# Patient Record
Sex: Female | Born: 1945 | Race: White | Hispanic: No | Smoking: Never smoker
Health system: Southern US, Community
[De-identification: ages and names within clinical notes are randomized; demographics above are authoritative.]

## PROBLEM LIST (undated history)

## (undated) DIAGNOSIS — R55 Syncope and collapse: Secondary | ICD-10-CM

## (undated) DIAGNOSIS — I639 Cerebral infarction, unspecified: Secondary | ICD-10-CM

## (undated) DIAGNOSIS — F32A Depression, unspecified: Secondary | ICD-10-CM

## (undated) DIAGNOSIS — Z79899 Other long term (current) drug therapy: Secondary | ICD-10-CM

## (undated) DIAGNOSIS — E119 Type 2 diabetes mellitus without complications: Secondary | ICD-10-CM

## (undated) DIAGNOSIS — F419 Anxiety disorder, unspecified: Secondary | ICD-10-CM

## (undated) DIAGNOSIS — F329 Major depressive disorder, single episode, unspecified: Secondary | ICD-10-CM

## (undated) DIAGNOSIS — IMO0001 Reserved for inherently not codable concepts without codable children: Secondary | ICD-10-CM

## (undated) DIAGNOSIS — K219 Gastro-esophageal reflux disease without esophagitis: Secondary | ICD-10-CM

## (undated) DIAGNOSIS — F0390 Unspecified dementia without behavioral disturbance: Secondary | ICD-10-CM

## (undated) DIAGNOSIS — Z950 Presence of cardiac pacemaker: Secondary | ICD-10-CM

## (undated) DIAGNOSIS — I10 Essential (primary) hypertension: Secondary | ICD-10-CM

## (undated) DIAGNOSIS — M797 Fibromyalgia: Secondary | ICD-10-CM

## (undated) DIAGNOSIS — M1990 Unspecified osteoarthritis, unspecified site: Secondary | ICD-10-CM

## (undated) DIAGNOSIS — I480 Paroxysmal atrial fibrillation: Secondary | ICD-10-CM

## (undated) DIAGNOSIS — I219 Acute myocardial infarction, unspecified: Secondary | ICD-10-CM

## (undated) DIAGNOSIS — I509 Heart failure, unspecified: Secondary | ICD-10-CM

## (undated) HISTORY — DX: Other long term (current) drug therapy: Z79.899

## (undated) HISTORY — PX: ABDOMINAL HYSTERECTOMY: SHX81

## (undated) HISTORY — DX: Essential (primary) hypertension: I10

## (undated) HISTORY — PX: INSERT / REPLACE / REMOVE PACEMAKER: SUR710

## (undated) HISTORY — DX: Syncope and collapse: R55

## (undated) HISTORY — DX: Paroxysmal atrial fibrillation: I48.0

## (undated) HISTORY — PX: APPENDECTOMY: SHX54

## (undated) HISTORY — PX: BACK SURGERY: SHX140

---

## 1997-11-26 ENCOUNTER — Ambulatory Visit (HOSPITAL_COMMUNITY): Admission: RE | Admit: 1997-11-26 | Discharge: 1997-11-26 | Payer: Self-pay

## 2005-08-25 ENCOUNTER — Ambulatory Visit: Payer: Self-pay | Admitting: Oncology

## 2006-02-21 ENCOUNTER — Ambulatory Visit: Payer: Self-pay | Admitting: Thoracic Surgery (Cardiothoracic Vascular Surgery)

## 2006-02-28 ENCOUNTER — Ambulatory Visit: Payer: Self-pay | Admitting: Thoracic Surgery (Cardiothoracic Vascular Surgery)

## 2006-04-04 ENCOUNTER — Ambulatory Visit: Payer: Self-pay | Admitting: Cardiothoracic Surgery

## 2007-05-30 ENCOUNTER — Encounter: Admission: RE | Admit: 2007-05-30 | Discharge: 2007-05-30 | Payer: Self-pay | Admitting: Gastroenterology

## 2008-02-07 ENCOUNTER — Observation Stay (HOSPITAL_COMMUNITY): Admission: EM | Admit: 2008-02-07 | Discharge: 2008-02-07 | Payer: Self-pay | Admitting: Emergency Medicine

## 2009-01-13 ENCOUNTER — Ambulatory Visit: Payer: Self-pay | Admitting: Cardiovascular Disease

## 2009-01-29 ENCOUNTER — Ambulatory Visit: Payer: Self-pay | Admitting: Cardiovascular Disease

## 2009-02-08 ENCOUNTER — Ambulatory Visit: Payer: Self-pay | Admitting: Internal Medicine

## 2009-02-18 ENCOUNTER — Ambulatory Visit: Payer: Self-pay | Admitting: Cardiovascular Disease

## 2009-03-04 ENCOUNTER — Encounter (INDEPENDENT_AMBULATORY_CARE_PROVIDER_SITE_OTHER): Payer: Self-pay | Admitting: *Deleted

## 2009-10-14 ENCOUNTER — Encounter (INDEPENDENT_AMBULATORY_CARE_PROVIDER_SITE_OTHER): Payer: Self-pay | Admitting: *Deleted

## 2010-01-30 IMAGING — CT CT ANGIO ABDOMEN
2 of 5 series · 16 of 46 positions shown, 18 images · IV contrast ([ID] OMNI 350)
Comparison: None

CTA ABDOMEN

CLINICAL DATA: Postprandial abdominal pain

CT ANGIOGRAPHY OF ABDOMEN AND PELVIS WITHOUT AND/OR WITH CONTRAST
TECHNIQUE: Multidetector CT imaging of the abdomen and pelvis was
performed before and during bolus injection of intravenous
contrast.  Multiplanar CT angiographic image reconstructions were
also generated to evaluate the vascular structures.
Contrast: 100 ml Omnipaque 350

[Series 5: recon 2: angio · axial · 0.74mm/px · z∈[-438,-4]mm · 13 of 286 slices shown, 15 images]
[im 13/286  soft-tissue]
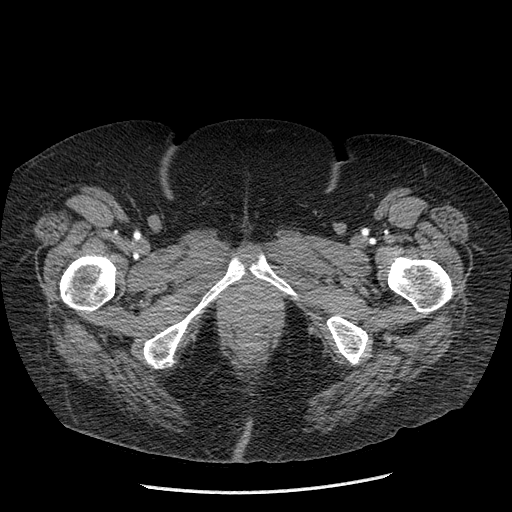
[im 13/286  bone]
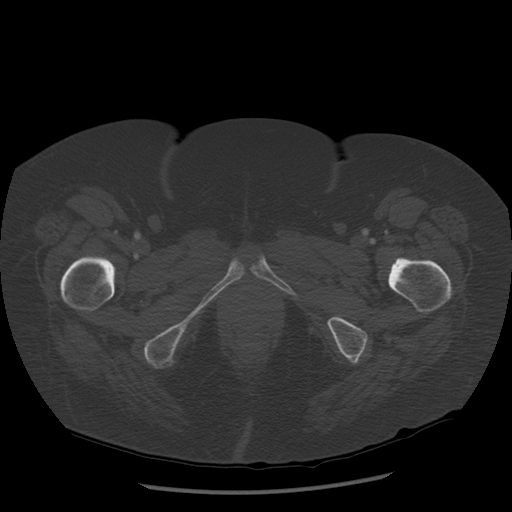
[im 38/286  soft-tissue]
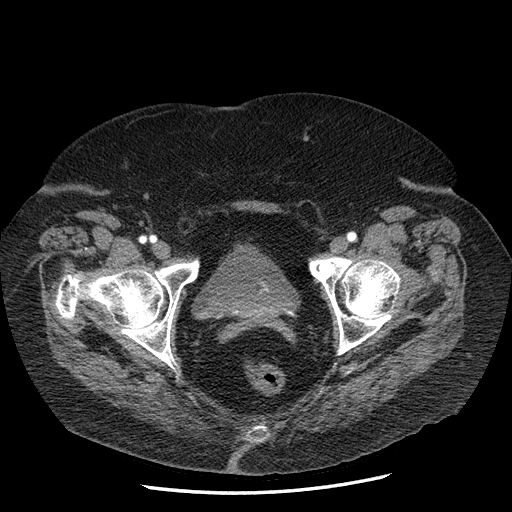
[im 62/286  soft-tissue]
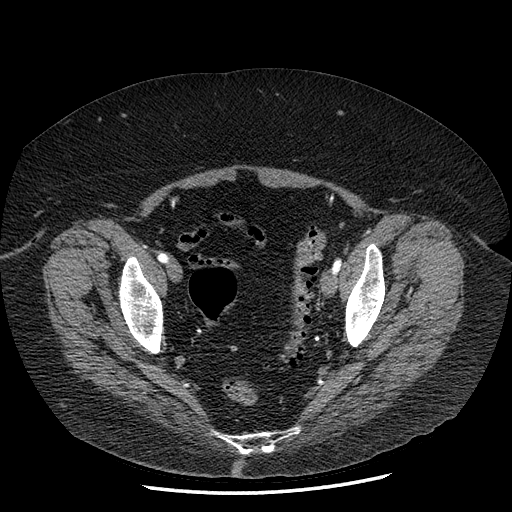
[im 75/286  soft-tissue]
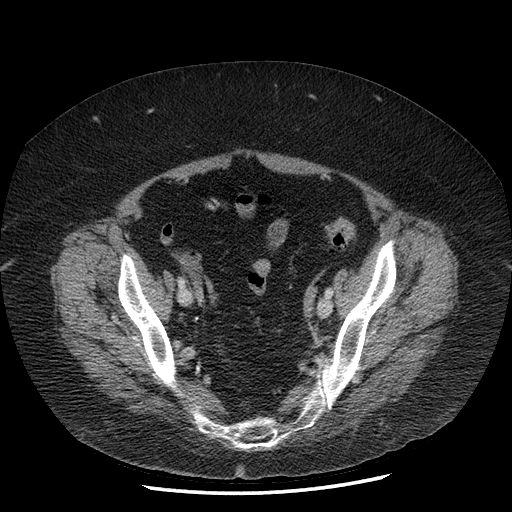
[im 100/286  soft-tissue]
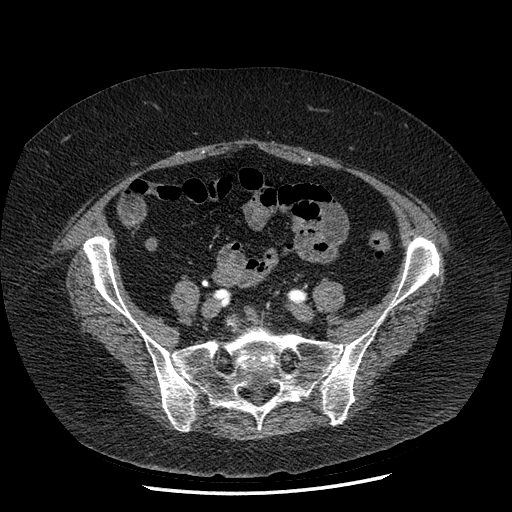
[im 124/286  soft-tissue]
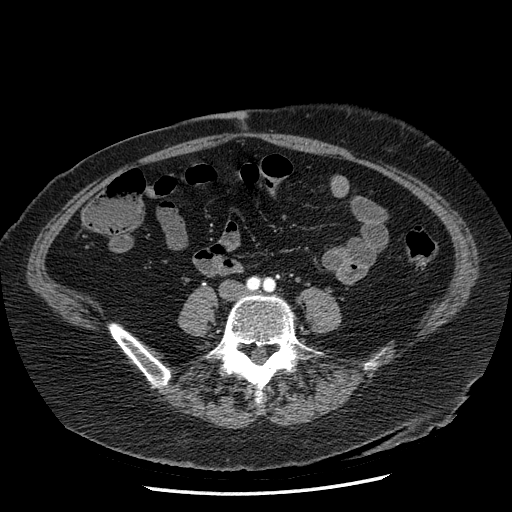
[im 149/286  soft-tissue]
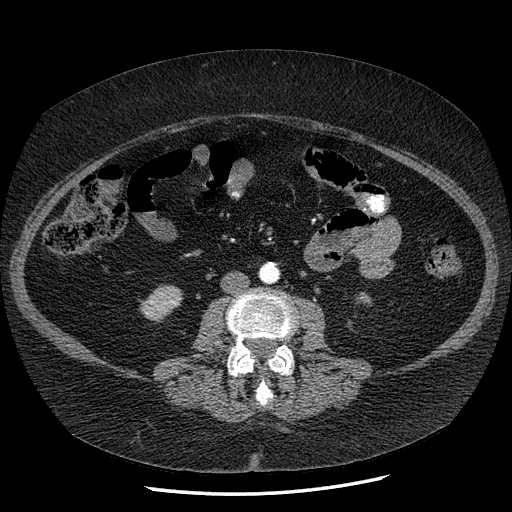
[im 162/286  soft-tissue]
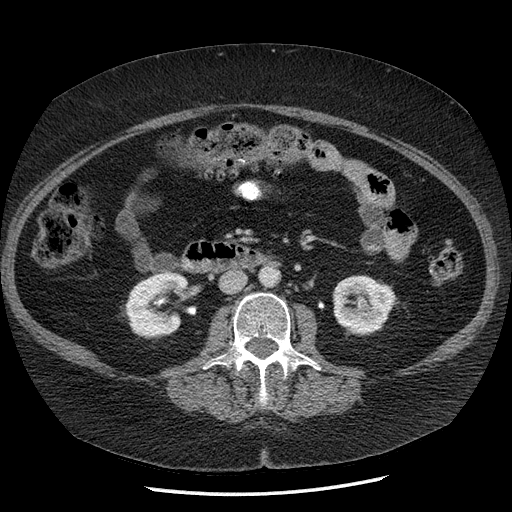
[im 186/286  soft-tissue]
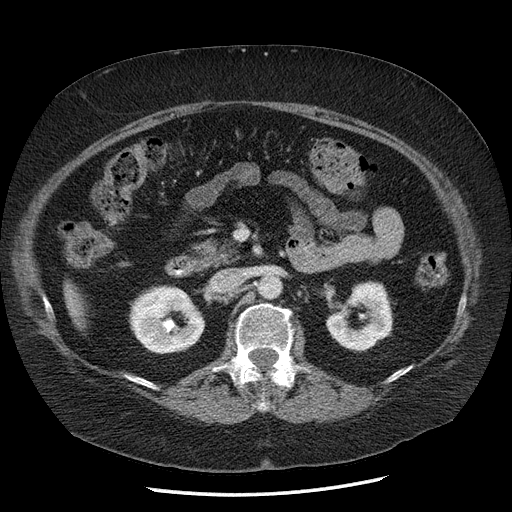
[im 186/286  bone]
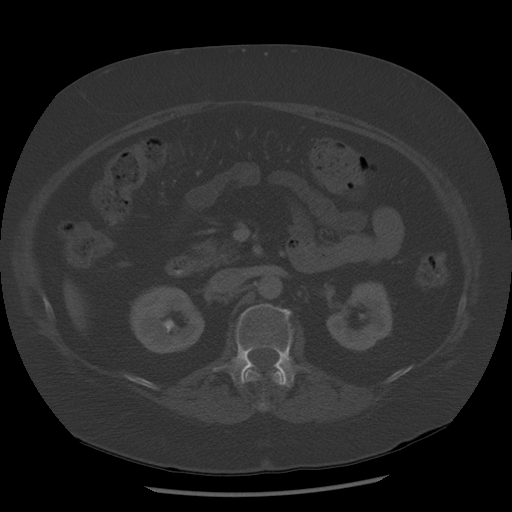
[im 211/286  soft-tissue]
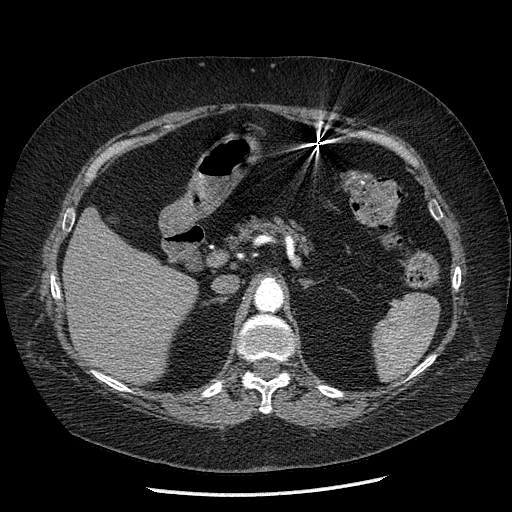
[im 224/286  soft-tissue]
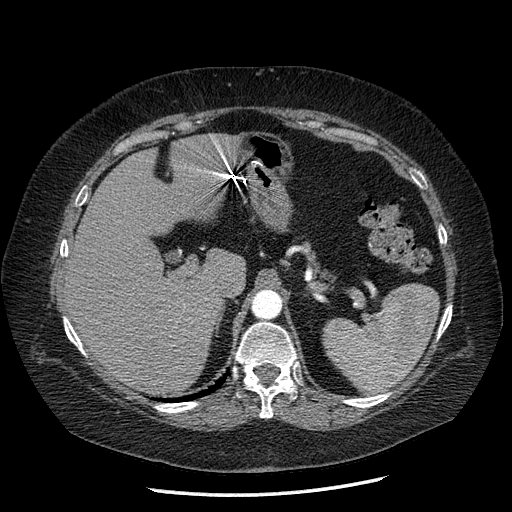
[im 248/286  soft-tissue]
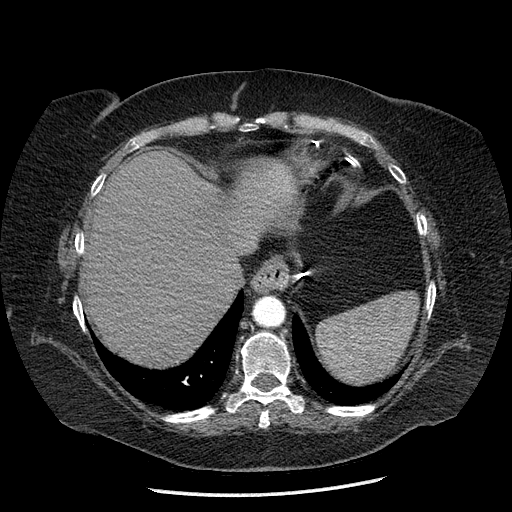
[im 273/286  soft-tissue]
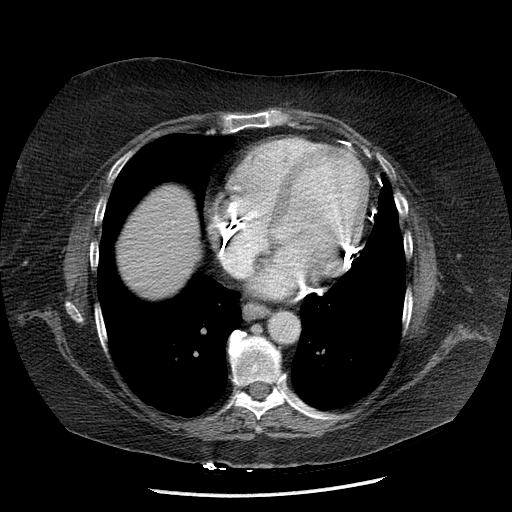

[Series 602: sagittal body · sagittal · 0.94mm/px · 3 of 153 slices shown]
[im 51/153  soft-tissue]
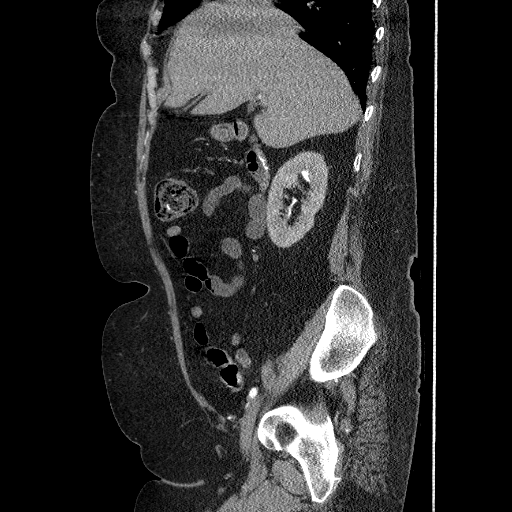
[im 68/153  soft-tissue]
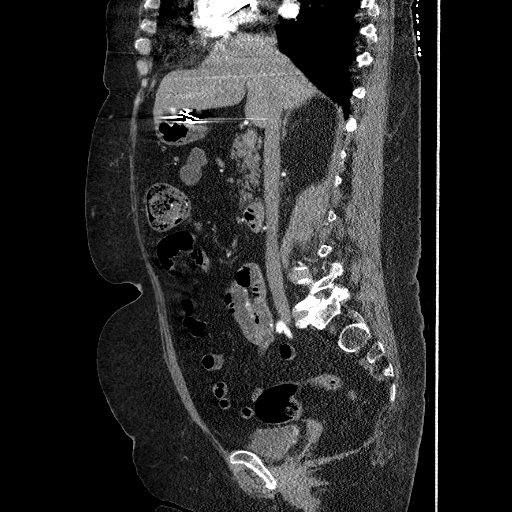
[im 85/153  soft-tissue]
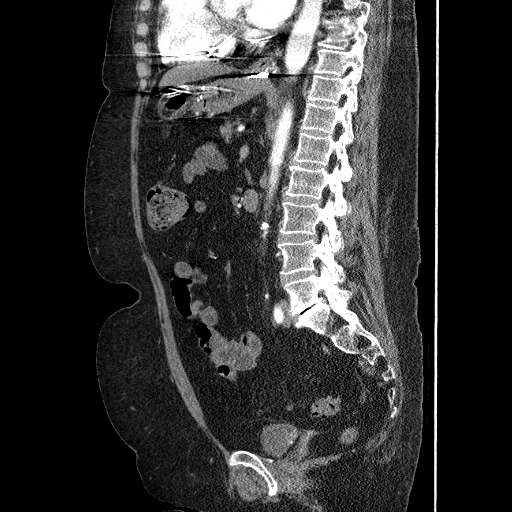

[16 of 46 positions shown; findings below may reference images not displayed]

FINDINGS: No evidence of aortic aneurysm.  Mild infrarenal
atherosclerotic disease.  Celiac artery, superior mesenteric
artery, and inferior mesenteric artery all widely patent.  There
are accessory inferior renal arteries bilaterally, arising just
above the IMA origin.  No significant stenosis in the main renal
arteries bilaterally.  The accessory renal arteries are very small
and difficult to evaluate.  There may be mild narrowing at the
origin of the right accessory renal artery.

Postoperative changes noted in the abdomen in the region of the
stomach.  Liver, spleen, pancreas, adrenals, kidneys unremarkable.
There is diverticular disease in the transverse and descending
colon.  No evidence of active diverticulitis.

No free fluid, free air, or adenopathy.  The patient is status post
cholecystectomy.

No acute bony abnormality.  There is mild cardiomegaly pacer wires
are noted.  Lung bases are clear.
IMPRESSION: No evidence of aortic aneurysm.  Mild atherosclerotic disease in
the infrarenal aorta.

No evidence of mesenteric artery stenosis.

Accessory renal arteries bilaterally, with possible mild narrowing
in the inferior right accessory renal artery.  Main renal arteries
are widely patent.

Left-sided colonic diverticulosis.

CTA PELVIS
FINDINGS: Extensive sigmoid diverticulosis.  No evidence of active
diverticulitis.  No free fluid, free air, or adenopathy.  No acute
bony abnormality.  Degenerative changes at the lumbosacral
junction.

Mild atherosclerotic disease noted in the external iliac arteries
and common femoral arteries bilaterally.  No significant stenosis.
Both internal iliac arteries are widely patent.
IMPRESSION: No evidence of significant arterial stenosis.  Mild atherosclerotic
disease.

Sigmoid diverticulosis.

## 2010-02-03 ENCOUNTER — Encounter (INDEPENDENT_AMBULATORY_CARE_PROVIDER_SITE_OTHER): Payer: Self-pay | Admitting: *Deleted

## 2010-02-10 NOTE — Procedures (Signed)
Summary: Cardiology Device Clinic   [facility] Specifications Following MD:  Sherryl Manges, MD     PPM Vendor:  [employer]     PPM Model Number:  [id number]     PPM Serial Number:  [id number] PPM DOI:  12/08/2003     PPM Implanting MD:  NOT IMPLANTED HERE  Lead 1    Location: RA     DOI: 01/15/2000     Model #: [id number]     Serial #: [id number]     Status: active Lead 2    Location: RV     DOI: 01/15/2000     Model #: [id number]     Serial #: [id number]     Status: active Lead 3    Location: LV     DOI: 02/28/2006     Model #: [id number]     Serial #: [id number]     Status: active  Magnet Response Rate:  BOL 85 ERI  65  Indications:  A-fib with AVnode ablation   PPM Follow Up Remote Check?  No Battery Voltage:  2.868 V     Battery Est. Longevity:  11 months     Pacer Dependent:  Yes       PPM Device Measurements Atrium  Impedance: 361 ohms,  Right Ventricle  Impedance: 322 ohms,  Left Ventricle  Impedance: 785 ohms,   Episodes MS Episodes:  2649     Percent Mode Switch:  5%     Coumadin:  Yes Ventricular High Rate:  0     Atrial Pacing:  75.2%     Ventricular Pacing:  100%  Parameters Mode:  DDD     Lower Rate Limit:  70     Upper Rate Limit:  120 Paced AV Delay:  220     Sensed AV Delay:  190 Tech Comments:  Reprogrammed to DDD.   Altha Harm, LPN  February 08, 2009 4:13 PM

## 2010-02-10 NOTE — Letter (Signed)
Summary: Device-Delinquent Check  [facility], Main Office  [address]   [place], [place] [address]   Phone: [phone]  Fax: [phone]     February 03, 2010 MRN: [id number]   ADDISSON FRATE [address] [place], [place]  [address]   Dear Ms. Cressy,  According to our records, you have not had your implanted device checked in the recommended period of time.  We are unable to determine appropriate device function without checking your device on a regular basis.  Please call our office to schedule an appointment, with Dr Graciela Husbands,  as soon as possible.  If you are having your device checked by another physician, please call us so that we may update our records.  Thank you,  Letta Moynahan, EMT  February 03, 2010 12:45 PM  [facility] Device [facility] certified

## 2010-02-10 NOTE — Letter (Signed)
Summary: Appointment - Missed  [facility], Main Office  [address]   [place], [place] [address]   Phone: [phone]  Fax: [phone]     March 04, 2009 MRN: [id number]   Kellie Acosta [address] [place], [place]  [address]   Dear [name],  Our records indicate you missed your appointment on 02/24/09 with [facility] for wound check. It is very important that we reach you to reschedule this appointment. We look forward to participating in your health care needs. Please contact us at the number listed above at your earliest convenience to reschedule this appointment.     Sincerely,   Ruel Favors Scheduling Team

## 2010-02-10 NOTE — Letter (Signed)
Summary: Device-Delinquent Check  [facility], Main Office  [address]   [place], [place] [address]   Phone: [phone]  Fax: [phone]     October 14, 2009 MRN: [id number]   Kellie Acosta [address] [place], [place]  [address]   Dear [name],  According to our records, you have not had your implanted device checked in the recommended period of time.  We are unable to determine appropriate device function without checking your device on a regular basis.  Please call our office to schedule an appointment, with Dr Graciela Husbands,  as soon as possible.  If you are having your device checked by another physician, please call us so that we may update our records.  Thank you,  Letta Moynahan, EMT  October 14, 2009 11:40 AM  [facility]

## 2010-02-18 ENCOUNTER — Telehealth (INDEPENDENT_AMBULATORY_CARE_PROVIDER_SITE_OTHER): Payer: Self-pay | Admitting: *Deleted

## 2010-02-20 ENCOUNTER — Encounter: Payer: Self-pay | Admitting: Internal Medicine

## 2010-02-24 NOTE — Progress Notes (Signed)
Summary: pt no longer live in the area  Phone Note Call from Patient   Reason for Call: Talk to Nurse, Talk to Doctor Summary of Call: pt has moved and dose not live in area anymore and if she moves back she will call us Initial call taken by: Omer Jack,  February 18, 2010 2:09 PM  Follow-up for Phone Call        noted. Follow-up by: Altha Harm, LPN,  February 18, 2010 4:58 PM

## 2010-03-08 NOTE — Cardiovascular Report (Signed)
Summary: Certified Letter Signed - Other (Pt moved, marked inactive)  Certified Letter Signed - Other (Pt moved, marked inactive)   Imported By: Debby Freiberg 03/02/2010 13:21:30  _____________________________________________________________________  External Attachment:    Type:   Image     Comment:   External Document

## 2010-04-25 LAB — GLUCOSE, CAPILLARY

## 2010-04-25 LAB — COMPREHENSIVE METABOLIC PANEL
ALT: 13 U/L (ref 0–35)
Alkaline Phosphatase: 100 U/L (ref 39–117)
CO2: 25 mEq/L (ref 19–32)
Glucose, Bld: 129 mg/dL — ABNORMAL HIGH (ref 70–99)
Potassium: 3.9 mEq/L (ref 3.5–5.1)
Sodium: 137 mEq/L (ref 135–145)
Total Protein: 6.1 g/dL (ref 6.0–8.3)

## 2010-04-25 LAB — CBC
Hemoglobin: 11.3 g/dL — ABNORMAL LOW (ref 12.0–15.0)
RBC: 3.93 MIL/uL (ref 3.87–5.11)
RDW: 15.9 % — ABNORMAL HIGH (ref 11.5–15.5)

## 2010-04-25 LAB — DIFFERENTIAL
Basophils Relative: 0 % (ref 0–1)
Eosinophils Absolute: 0.1 10*3/uL (ref 0.0–0.7)
Eosinophils Relative: 1 % (ref 0–5)
Monocytes Relative: 9 % (ref 3–12)
Neutrophils Relative %: 70 % (ref 43–77)

## 2010-04-25 LAB — URINALYSIS, ROUTINE W REFLEX MICROSCOPIC
Nitrite: NEGATIVE
Specific Gravity, Urine: 1.03 (ref 1.005–1.030)
pH: 5.5 (ref 5.0–8.0)

## 2010-04-25 LAB — AMMONIA: Ammonia: 20 umol/L (ref 11–35)

## 2010-05-24 NOTE — Letter (Signed)
February 08, 2009    Brayton El, MD  [phone] [address]  [place], [place]  [address]   RE:  Acosta, Kellie  MRN:  [id number]  /  DOB:  Jun 14, 1945   Dear Kellie Acosta:   It was a pleasure seeing Kellie Acosta at your request to establish  develop followup for a previously implanted CRT-P as well as evaluation  of syncope.   Kellie Acosta is a 65 year old woman with a history of a pacemaker  implanted in 2002 following an A-V junction ablation which apparently  was complicated by a pacemaker-induced cardiomyopathy.  In 2005, she  underwent upgrade to a CRT device which was complicated by diaphragmatic  pacing and resulted in the placement of an epicardial lead in 2008.  Her  most recently assessed ejection fraction is 55%.  We do not have  information as to what it was at its nadir.   She has a history of atrial fibrillation as noted; this has been  paroxysmal.  She was treated with Coumadin for some time.  However,  because of GI bleeding, this was discontinued.   Her functional capacity is quite limited.  She describes shortness of  breath at about 100 yards.  She also underwent Myoview scanning because  of this, and that was negative.  Her shortness of breath which has been  progressive has been unassociated with chest discomfort.   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  She has a history of recurrent syncope over the  last year.  Almost all of these episodes occur within a minute of going  from sitting to standing.  It is frequently without warning.  She has  shower intolerance as well.   There was an episode for which we have only incomplete records when she  became nauseated, lightheaded and lost consciousness.  She said she was  out for 11 hours, and she said that Dr. Weldon Inches in the [facility] Room said he was surprised to see her which she inferred was  that he thought that she was going to die.  Again,  those complete  records are not available, although the brief record that we have  describes dehydration as the primary discharge diagnosis.   PAST MEDICAL HISTORY:  In addition to the above is notable for the  following:  1. GE reflux disease with esophageal stricture.  2. Chronic pain requiring a fentanyl patch.  3. Adrenal adenoma.   REVIEW OF SYSTEMS:  In addition to the above is notable for allergies  and asthma, constipation and fatigue, kidney diseases, anxiety and  depression, gout, arthritis, and urinary problems.   PAST SURGICAL HISTORY:  Noted as negative.   FAMILY HISTORY:  Noncontributory.   MEDICATIONS:  1. Lexapro 10.  2. Lidoderm.  3. Gabapentin 600 b.i.d.  4. Crestor 20.  5. Lyrica 300 t.i.d.  6. Trimethoprim.  7. Diovan 320.  8. Potassium.  9. Nabumetone 780 b.i.d.  10.Spironolactone 25.  11.Alprazolam 1 b.i.d.  12.Requip 3 at bedtime.  13.Dicyclomine.  14.Ambien 10 at bedtime.  15.Meloxicam 15.  16.Amlodipine 10.  17.Furosemide 80.  18.Metoprolol tartrate 100 b.i.d.  19.Detrol.  20.Aricept 5.  21.__________ 75 b.i.d.  22.Loratadine.  23.Cipro.  24.Omeprazole.  25.Indomethacin.   She has no known drug allergies.   I should note that she has significant edema.   PHYSICAL EXAMINATION:  GENERAL:  She is an older Caucasian female  expressing satisfaction at  the care in our office.  VITAL SIGNS:  Blood pressure is 107/74, pulse 93, weight 214.6 pounds  which is up a few pounds over the last couple of months.  HEENT:  Normal apart from poor dentition.  NECK:  Neck veins were flat.  Carotids were brisk and full bilaterally,  no bruits.  BACK:  Without kyphosis or scoliosis.  LUNGS:  Clear.  CARDIAC:  Heart sounds were regular without murmurs or gallops.  ABDOMEN:  Soft with active bowel sounds without midline pulsation or  hepatomegaly.  Femoral pulses were 2+.  EXTREMITIES:  Distal pulses were intact.  There is no clubbing, cyanosis  or  edema.  NEUROLOGICAL:  Grossly normal.   Orthostatic vital signs demonstrated fall in blood pressure from 135 to  113 with standing.  That persisted at 115 over 5 minutes.   We interrogated her device and noted that her heart rate excursion  seemed to be a little bit right shifted in that only 16% of her beats  were in the lower .  I turned off her rate response to see how this  would help, and we walked her around the office; there seemed to be some  mild improvement in her exercise tolerance.   IMPRESSION:  1. History of atrial fibrillation, paroxysmal.  2. Status post atrioventricular junction for the above.  3. Status post pacer for the above with subsequent development of      pacemaker-induced cardiomyopathy with upgrade to a biventricular      device.  This was complicated by diaphragmatic stimulation, and the      patient has an epicardial LV lead.  4. History of hypertension.  5. 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  6. Polypharmacy.   So, Mrs. Capek pacemaker was reprogrammed to inactivate rate  response.  Hopefully, this will translate into improved exercise  performance.  I think her syncope is orthostatic in  nature, and I have  given her a variety of multiple contraction exercises prior to standing  and immediately upon standing to try to abrogate some of her symptoms.  She also is on a lot of medication, and I wonder about interactions  between these medications and the potential for them to aggravate  orthostatic lightheadedness.  She is also on a lot of psychotropic  medications, and these too may be narrowed down if possible.   Her pacemaker battery has an estimated 11 months.  We will plan to see  her again in 6 months' time and will continue to transtelephonic  monitoring in the interim.   Thank you for the consultation.    Sincerely,      Duke Salvia, MD, [facility]  Electronically Signed    SCK/MedQ  DD: 02/08/2009  DT:  02/08/2009  Job #: [id number]
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
## 2010-05-24 NOTE — Assessment & Plan Note (Signed)
[facility]                        [place] [facility] OFFICE NOTE   Kellie Acosta, Kellie Acosta                         MRN:          [id number]  DATE:01/29/2009                            DOB:          28-Jun-1945    PROBLEM LIST:  1. Atrial fibrillation status post atrioventricular node ablation in      2002 and placement of a permanent pacemaker which was upgraded to a      biventricular pacemaker in 2005 secondary to a single lead      pacemaker-induced cardiomyopathy.  At that time, she had      diaphragmatic pacing which resulted in an epicardial pacemaker lead      placement in 2008.  She was on Coumadin up until this time, but was      taken off eventually for gastric ulcers and gastrointestinal      bleeding.  2. Malignant hypertension.  3. Hyperlipidemia.  4. History of syncope.  5. Esophageal stricture.  6. Adrenal adenoma.  7. Gastroesophageal reflux disease.  8. Chronic pain requiring a fentanyl patch.   INTERVAL HISTORY:  The patient states since her last visit, she  continues to have some intermittent dizziness and chest discomfort.  She  states the chest discomfort is usually with exertion and has been  present for at least 6 months.  She also tells [place] that she has had 5  normal left heart catheterizations in the past.  Since her last visit,  she had a nuclear stress test which show a fixed defect involving the  anterior wall likely related to breast attenuation, but a normal  ejection fraction and no evidence of inducible ischemia.  Since last  visit, she has had intermittent dizzy episodes, but denies any further  episodes of syncope.  She has been compliant with her medications, but  asks that her cardiovascular medications be reduced in number.   REVIEW OF SYSTEMS:  Positive for continued episodes of diaphoresis that  occur at night and during the day.  They are not necessarily related to  exertion.   PHYSICAL EXAMINATION:  VITAL  SIGNS:  Blood pressure is 137/87, pulse is  85, sating 96% on room air, and that she weighs 213 pounds.  GENERAL:  No acute distress.  HEENT:  Normocephalic, atraumatic.  HEART:  Regular rate and rhythm.  LUNGS:  Clear bilaterally.  ABDOMEN:  Soft, nontender.  EXTREMITIES:  Trace edema.   LABORATORY FINDINGS:  Review of the stress test as above in interval  history.  Review of the patient's labs, TSH was 0.991, aldosterone 11.8,  renin 2.9, white count 7, hemoglobin 14, hematocrit 42, platelet count  319.  Total cholesterol 340, LDL 241, triglycerides 199.  INR 1.0.  Sodium 136, potassium 4.1, chloride 104, CO2 24, BUN 15, creatinine  0.85, glucose 136.  LFTs within normal limits.   ASSESSMENT AND PLAN:  1. Atrial fibrillation.  The patient is status post atrioventricular      node ablation.  She is not on Coumadin secondary to history of      gastrointestinal bleeding.  She is on metoprolol  tartrate 100 mg      twice a day.  Today, we will stop her digoxin as she does not have      left ventricular dysfunction and it is likely not of any benefit      for rate control.  2. Chest discomfort.  She has had a negative stress test and multiple      heart catheterizations that had been negative in the past.  I do      not think that this chest discomfort is ischemia in origin.  She      does have a history of esophageal strictures which may be      contributing.  She plans on seeing a gastroenterologist in the near      future for a followup colonoscopy as her last colonoscopy showed      multiple polyps.  At that time, EGD can also be considered.  3. Hyperlipidemia.  The patient's LDL is far from goal.  She states      compliance with Crestor 10 mg daily, which we will increase this to      20 mg daily.  4. Diaphoresis.  I do not think this is related to coronary artery      disease.  We will recheck a TSH level with a free T4 and total T3.      We also recommended she see a  gastroenterologist as she does have a      history of a premalignant polyps on colonoscopy.  Other labs are      within normal limits and she is up-to-date with her mammograms.  5. Hypertension.  Blood pressure is controlled today.  She should      continue on her current medical regimen for now.   The patient is scheduled to see Dr. Graciela Husbands at the end of this month for  pacemaker interrogation and to identify any rhythm abnormalities that  may be contributing to her dizziness and history of syncope.  I will see  the patient back in 3 months' time.     Brayton El, MD  Electronically Signed    SGA/MedQ  DD: 01/29/2009  DT: 01/30/2009  Job #: [id number]

## 2010-05-27 NOTE — Assessment & Plan Note (Signed)
[facility]                        [facility] OFFICE NOTE   Kellie Acosta, Kellie Acosta                         MRN:          [id number]  DATE:01/14/2009                            DOB:          Feb 03, 1945    CHIEF COMPLAINT:  Palpitations and hypertension.   HISTORY OF PRESENT ILLNESS:  Kellie Acosta is a 65 year old white female  past medical history significant for atrial fibrillation, hypertension,  hyperlipidemia, gastric ulcers who is presenting to establish  cardiovascular care.  In 2002, the patient had an AV node ablation for  atrial fibrillation and placement of a permanent pacemaker.  In 2005, it  was upgraded to BiV pacemaker due to cardiomyopathy from the single-lead  pacemaker.  She had improvement in her symptoms but has subsequently had  diaphragmatic pacing which resulted in epicardial pacemaker lead  placement in 2008.  She had been on Coumadin up to this point and was  taken off Coumadin about a year ago, most likely for issues with gastric  ulcers and bleeding.  Her most recent ejection fraction was from an echo  dated November 23, 2008 that showed normal left ventricular systolic  function and no significant regurgitation.  The patient states that for  the past year, she has had recurrent dizzy episodes.  Within the past  month she had two such episodes.  She states that the episodes usually  start with her feeling palpitations in her chest followed by nausea.  These resolve within 5 minutes.  However, if she stands up during these  episodes, she passes out.  One of these episodes occurred within the  past week.  However, she did stand up and the palpitations went away.  She did have one syncopal episode after standing with the palpitations  several weeks ago.  The patient states she almost always feels some  degree of palpitations.  However, they are not frequently severe.  In  addition to the palpitations, the patient also describes  of intermittent  chest discomfort that she really is unable to explain in any amount of  detail.  She states that she has struggled with uncontrolled  hypertension for many years and has a strong desire to decrease the  amount of medications she is taking if at all possible.   PAST MEDICAL HISTORY:  As above in HPI.  The patient also has a history  of esophageal stricture, adrenal adenoma, GERD.   SOCIAL HISTORY:  No tobacco.  No significant amounts of alcohol.   Family history is positive for coronary disease.  However, it is unclear  whether it is premature coronary disease or not.   ALLERGIES:  Penicillin.   MEDICATIONS:  Following medication list needs to be confirmed with the  actual pill bottles that she is taking however, the patient brings in a  list today that has carvedilol 12.5 mg b.i.d., amlodipine 10 mg daily,  metoprolol tartrate 100 mg b.i.d., isosorbide 60 mg daily, digoxin 0.125  mg daily, Catapres 0.1 mg b.i.d., Aldactone 25 mg daily, Diovan 320 mg  daily, Crestor 20 mg daily, Protonix 40 mg daily, dicyclomine, Requip,  Ambien, Lexapro, Lidoderm patch, gabapentin, Flexeril, Lyrica,  trimethoprim, potassium, nabumetone, alprazolam, meloxicam, Vesicare,  Lasix 80 mg b.i.d., Percocet p.r.n., Spiriva p.r.n., Nasonex p.r.n.   REVIEW OF SYSTEMS:  The patient endorses fatigue, some symptoms of  depression that are oftentimes related to her son who had a tragic  sledding accident several years ago.  She complains of some occasional  abdominal discomfort, constipation, arthritis, gout, anxiety, breathing  difficulties secondary to asthma and allergies.  Other systems as in  HPI, otherwise negative.   PHYSICAL EXAM:  VITAL SIGNS:  Blood pressure 161/88, pulse 74, satting  95% on room air.  She weighs 211 pounds.  GENERAL:  She is in no acute distress.  HEENT:  Normocephalic, atraumatic.  NECK:  Supple.  There was no JVD.  There are no carotid bruits.  HEART:  Regular  rate and rhythm without murmur, rub, or gallop.  Heart  sounds were distant.  LUNGS:  Clear bilaterally.  ABDOMEN:  Soft and nondistended.  EXTREMITIES:  Trace bilateral lower extremity edema.  PSYCHIATRIC:  The patient is appropriate, does appear somewhat anxious.  MUSCULOSKELETAL:  The patient has 5/5 bilateral upper and lower  extremity strength.   EKG from today independently interpreted by myself demonstrates AV  sequential pacing at a rate of 72 beats per minute on review of the  patient's medical record as above in HPI.   Review of the patient's labs dated October 2010, sodium 137, potassium  4.1, chloride 107, CO2 21, BUN 13, creatinine 0.9, glucose 122, BNP was  83, TSH is 1.07.   ASSESSMENT:  A 65 year old white female with multiple medical problems  including paroxysmal atrial fibrillation, status post AV nodal ablation  and BiV pacemaker and current BiV pacemaker, gastric ulcer disease  preventing anticoagulation with Coumadin and malignant hypertension who  is presenting with ill-defined chest discomfort, syncope and  palpitations.  The patient has not had an ischemia evaluation in a  number of years and the chest discomfort she is experiencing could  certainly be secondary to obstructive pulmonary disease.  The  palpitations she is experiencing could be secondary to atrial  fibrillation.  However, a recent thorough interrogation by the pacemaker  rep indicates that she has had numerous atrial tachycardic episodes that  appear to be secondary to far field sensing.  The syncopal events she is  having may be secondary to neurocardiogenic syncope.  The patient also  has very malignant hypertension.  She is on numerous blood pressure  medications, possibly including two different beta-blockers, yet still  has uncontrolled hypertension.  It is unclear whether this history of  adrenal adenoma may be involved.   PLAN:  We asked the patient that she return in the next day or  two with  the actual pill bottles that she is taking so she can confirm the  antihypertensives that she is on.  We will order a lexiscan Cardiolite  in order to reevaluate the left ventricular function, although was  normal several months ago and to rule out inducible ischemia.  We will  order a postop labs including a CMP, CBC, TSH, renin and aldosterone  level.  She is to see Dr. Berton Mount in [facility] on the 30th of this  month in order to have her pacemaker more thoroughly evaluated to rule  out any  possible arrhythmia as a cause for her syncope.  It should be noted that  the tragic accident that  the patient's son had several years ago  could certainly be contributing  to the patient's perception of her symptoms.     Brayton El, MD  Electronically Signed    SGA/MedQ  DD: 01/14/2009  DT: 01/15/2009  Job #: [id number]

## 2010-08-08 ENCOUNTER — Encounter: Payer: Self-pay | Admitting: Cardiovascular Disease

## 2010-08-31 ENCOUNTER — Encounter: Payer: Self-pay | Admitting: Cardiovascular Disease

## 2010-09-16 ENCOUNTER — Encounter: Payer: Self-pay | Admitting: Cardiovascular Disease

## 2014-12-15 ENCOUNTER — Encounter (HOSPITAL_COMMUNITY): Payer: Self-pay | Admitting: *Deleted

## 2014-12-15 ENCOUNTER — Inpatient Hospital Stay (HOSPITAL_COMMUNITY): Payer: Medicare Other

## 2014-12-15 ENCOUNTER — Inpatient Hospital Stay (HOSPITAL_COMMUNITY)
Admission: AD | Admit: 2014-12-15 | Discharge: 2014-12-24 | DRG: 539 | Disposition: A | Discharge status: Skilled Nursing Facility | Payer: Medicare Other | Source: Other Acute Inpatient Hospital | Attending: Internal Medicine | Admitting: Internal Medicine

## 2014-12-15 DIAGNOSIS — Z515 Encounter for palliative care: Secondary | ICD-10-CM | POA: Insufficient documentation

## 2014-12-15 DIAGNOSIS — E1169 Type 2 diabetes mellitus with other specified complication: Secondary | ICD-10-CM | POA: Diagnosis not present

## 2014-12-15 DIAGNOSIS — M4640 Discitis, unspecified, site unspecified: Secondary | ICD-10-CM | POA: Diagnosis present

## 2014-12-15 DIAGNOSIS — L89620 Pressure ulcer of left heel, unstageable: Secondary | ICD-10-CM | POA: Diagnosis not present

## 2014-12-15 DIAGNOSIS — Z79899 Other long term (current) drug therapy: Secondary | ICD-10-CM | POA: Diagnosis not present

## 2014-12-15 DIAGNOSIS — Z90710 Acquired absence of both cervix and uterus: Secondary | ICD-10-CM | POA: Diagnosis not present

## 2014-12-15 DIAGNOSIS — I252 Old myocardial infarction: Secondary | ICD-10-CM

## 2014-12-15 DIAGNOSIS — K219 Gastro-esophageal reflux disease without esophagitis: Secondary | ICD-10-CM | POA: Diagnosis present

## 2014-12-15 DIAGNOSIS — I2510 Atherosclerotic heart disease of native coronary artery without angina pectoris: Secondary | ICD-10-CM | POA: Diagnosis present

## 2014-12-15 DIAGNOSIS — M797 Fibromyalgia: Secondary | ICD-10-CM | POA: Diagnosis present

## 2014-12-15 DIAGNOSIS — J449 Chronic obstructive pulmonary disease, unspecified: Secondary | ICD-10-CM | POA: Diagnosis present

## 2014-12-15 DIAGNOSIS — Z9981 Dependence on supplemental oxygen: Secondary | ICD-10-CM

## 2014-12-15 DIAGNOSIS — M8610 Other acute osteomyelitis, unspecified site: Secondary | ICD-10-CM | POA: Diagnosis not present

## 2014-12-15 DIAGNOSIS — M4626 Osteomyelitis of vertebra, lumbar region: Principal | ICD-10-CM | POA: Diagnosis present

## 2014-12-15 DIAGNOSIS — D649 Anemia, unspecified: Secondary | ICD-10-CM | POA: Diagnosis present

## 2014-12-15 DIAGNOSIS — I10 Essential (primary) hypertension: Secondary | ICD-10-CM | POA: Diagnosis present

## 2014-12-15 DIAGNOSIS — N179 Acute kidney failure, unspecified: Secondary | ICD-10-CM | POA: Diagnosis present

## 2014-12-15 DIAGNOSIS — G8929 Other chronic pain: Secondary | ICD-10-CM | POA: Diagnosis not present

## 2014-12-15 DIAGNOSIS — I959 Hypotension, unspecified: Secondary | ICD-10-CM | POA: Diagnosis not present

## 2014-12-15 DIAGNOSIS — M545 Low back pain, unspecified: Secondary | ICD-10-CM | POA: Insufficient documentation

## 2014-12-15 DIAGNOSIS — G9340 Encephalopathy, unspecified: Secondary | ICD-10-CM | POA: Diagnosis present

## 2014-12-15 DIAGNOSIS — Z950 Presence of cardiac pacemaker: Secondary | ICD-10-CM | POA: Diagnosis present

## 2014-12-15 DIAGNOSIS — I509 Heart failure, unspecified: Secondary | ICD-10-CM | POA: Diagnosis not present

## 2014-12-15 DIAGNOSIS — F411 Generalized anxiety disorder: Secondary | ICD-10-CM | POA: Diagnosis present

## 2014-12-15 DIAGNOSIS — L8990 Pressure ulcer of unspecified site, unspecified stage: Secondary | ICD-10-CM | POA: Insufficient documentation

## 2014-12-15 DIAGNOSIS — E870 Hyperosmolality and hypernatremia: Secondary | ICD-10-CM | POA: Diagnosis present

## 2014-12-15 DIAGNOSIS — R627 Adult failure to thrive: Secondary | ICD-10-CM | POA: Diagnosis not present

## 2014-12-15 DIAGNOSIS — Z885 Allergy status to narcotic agent status: Secondary | ICD-10-CM | POA: Diagnosis not present

## 2014-12-15 DIAGNOSIS — Z8673 Personal history of transient ischemic attack (TIA), and cerebral infarction without residual deficits: Secondary | ICD-10-CM | POA: Diagnosis not present

## 2014-12-15 DIAGNOSIS — Z66 Do not resuscitate: Secondary | ICD-10-CM | POA: Diagnosis not present

## 2014-12-15 DIAGNOSIS — R0602 Shortness of breath: Secondary | ICD-10-CM

## 2014-12-15 DIAGNOSIS — M1990 Unspecified osteoarthritis, unspecified site: Secondary | ICD-10-CM | POA: Diagnosis present

## 2014-12-15 DIAGNOSIS — M4646 Discitis, unspecified, lumbar region: Secondary | ICD-10-CM | POA: Diagnosis present

## 2014-12-15 DIAGNOSIS — E118 Type 2 diabetes mellitus with unspecified complications: Secondary | ICD-10-CM

## 2014-12-15 DIAGNOSIS — E876 Hypokalemia: Secondary | ICD-10-CM | POA: Diagnosis not present

## 2014-12-15 DIAGNOSIS — E119 Type 2 diabetes mellitus without complications: Secondary | ICD-10-CM

## 2014-12-15 DIAGNOSIS — E785 Hyperlipidemia, unspecified: Secondary | ICD-10-CM | POA: Diagnosis not present

## 2014-12-15 DIAGNOSIS — I480 Paroxysmal atrial fibrillation: Secondary | ICD-10-CM | POA: Diagnosis not present

## 2014-12-15 DIAGNOSIS — R410 Disorientation, unspecified: Secondary | ICD-10-CM

## 2014-12-15 DIAGNOSIS — F19931 Other psychoactive substance use, unspecified with withdrawal delirium: Secondary | ICD-10-CM | POA: Diagnosis present

## 2014-12-15 DIAGNOSIS — M869 Osteomyelitis, unspecified: Secondary | ICD-10-CM

## 2014-12-15 DIAGNOSIS — J9601 Acute respiratory failure with hypoxia: Secondary | ICD-10-CM | POA: Diagnosis present

## 2014-12-15 DIAGNOSIS — Z880 Allergy status to penicillin: Secondary | ICD-10-CM | POA: Diagnosis not present

## 2014-12-15 HISTORY — DX: Unspecified osteoarthritis, unspecified site: M19.90

## 2014-12-15 HISTORY — DX: Fibromyalgia: M79.7

## 2014-12-15 HISTORY — DX: Reserved for inherently not codable concepts without codable children: IMO0001

## 2014-12-15 HISTORY — DX: Anxiety disorder, unspecified: F41.9

## 2014-12-15 HISTORY — DX: Gastro-esophageal reflux disease without esophagitis: K21.9

## 2014-12-15 HISTORY — DX: Unspecified dementia, unspecified severity, without behavioral disturbance, psychotic disturbance, mood disturbance, and anxiety: F03.90

## 2014-12-15 HISTORY — DX: Presence of cardiac pacemaker: Z95.0

## 2014-12-15 HISTORY — DX: Depression, unspecified: F32.A

## 2014-12-15 HISTORY — DX: Major depressive disorder, single episode, unspecified: F32.9

## 2014-12-15 HISTORY — DX: Type 2 diabetes mellitus without complications: E11.9

## 2014-12-15 HISTORY — DX: Heart failure, unspecified: I50.9

## 2014-12-15 HISTORY — DX: Cerebral infarction, unspecified: I63.9

## 2014-12-15 HISTORY — DX: Acute myocardial infarction, unspecified: I21.9

## 2014-12-15 LAB — CBC WITH DIFFERENTIAL/PLATELET
BASOS ABS: 0 10*3/uL (ref 0.0–0.1)
BASOS PCT: 0 %
EOS PCT: 5 %
Eosinophils Absolute: 0.7 10*3/uL (ref 0.0–0.7)
HEMATOCRIT: 35.5 % — AB (ref 36.0–46.0)
Hemoglobin: 10.5 g/dL — ABNORMAL LOW (ref 12.0–15.0)
Lymphocytes Relative: 12 %
Lymphs Abs: 1.6 10*3/uL (ref 0.7–4.0)
MCH: 26 pg (ref 26.0–34.0)
MCHC: 29.6 g/dL — ABNORMAL LOW (ref 30.0–36.0)
MCV: 87.9 fL (ref 78.0–100.0)
MONO ABS: 0.6 10*3/uL (ref 0.1–1.0)
Monocytes Relative: 4 %
NEUTROS ABS: 10.6 10*3/uL — AB (ref 1.7–7.7)
Neutrophils Relative %: 79 %
PLATELETS: 206 10*3/uL (ref 150–400)
RBC: 4.04 MIL/uL (ref 3.87–5.11)
RDW: 17.8 % — AB (ref 11.5–15.5)
WBC: 13.5 10*3/uL — AB (ref 4.0–10.5)

## 2014-12-15 LAB — COMPREHENSIVE METABOLIC PANEL
ALBUMIN: 2.5 g/dL — AB (ref 3.5–5.0)
ALK PHOS: 76 U/L (ref 38–126)
ALT: 19 U/L (ref 14–54)
ANION GAP: 11 (ref 5–15)
AST: 28 U/L (ref 15–41)
BILIRUBIN TOTAL: 0.5 mg/dL (ref 0.3–1.2)
BUN: 38 mg/dL — ABNORMAL HIGH (ref 6–20)
CALCIUM: 8.9 mg/dL (ref 8.9–10.3)
CO2: 25 mmol/L (ref 22–32)
Chloride: 107 mmol/L (ref 101–111)
Creatinine, Ser: 1.34 mg/dL — ABNORMAL HIGH (ref 0.44–1.00)
GFR calc Af Amer: 45 mL/min — ABNORMAL LOW (ref >60)
GFR, EST NON AFRICAN AMERICAN: 39 mL/min — AB (ref >60)
GLUCOSE: 114 mg/dL — AB (ref 65–99)
Potassium: 3.6 mmol/L (ref 3.5–5.1)
Sodium: 143 mmol/L (ref 135–145)
TOTAL PROTEIN: 5.8 g/dL — AB (ref 6.5–8.1)

## 2014-12-15 LAB — SEDIMENTATION RATE: Sed Rate: 65 mm/hr — ABNORMAL HIGH (ref 0–22)

## 2014-12-15 LAB — TSH: TSH: 2.346 u[IU]/mL (ref 0.350–4.500)

## 2014-12-15 LAB — PROTIME-INR
INR: 1.22 (ref 0.00–1.49)
Prothrombin Time: 15.6 seconds — ABNORMAL HIGH (ref 11.6–15.2)

## 2014-12-15 LAB — GLUCOSE, CAPILLARY
GLUCOSE-CAPILLARY: 92 mg/dL (ref 65–99)
Glucose-Capillary: 114 mg/dL — ABNORMAL HIGH (ref 65–99)

## 2014-12-15 LAB — C-REACTIVE PROTEIN: CRP: 1.6 mg/dL — ABNORMAL HIGH (ref <1.0)

## 2014-12-15 MED ORDER — ENOXAPARIN SODIUM 40 MG/0.4ML ~~LOC~~ SOLN
40.0000 mg | SUBCUTANEOUS | Status: DC
  Administered 2014-12-15: 40 mg via SUBCUTANEOUS
  Filled 2014-12-15: qty 0.4

## 2014-12-15 MED ORDER — INSULIN ASPART 100 UNIT/ML ~~LOC~~ SOLN
0.0000 [IU] | Freq: Three times a day (TID) | SUBCUTANEOUS | Status: DC
  Administered 2014-12-17 – 2014-12-19 (×6): 1 [IU] via SUBCUTANEOUS
  Administered 2014-12-20: 2 [IU] via SUBCUTANEOUS
  Administered 2014-12-22: 1 [IU] via SUBCUTANEOUS

## 2014-12-15 MED ORDER — POLYETHYLENE GLYCOL 3350 17 G PO PACK
17.0000 g | PACK | Freq: Every day | ORAL | Status: DC | PRN
Start: 2014-12-15 — End: 2014-12-18

## 2014-12-15 MED ORDER — ONDANSETRON HCL 4 MG/2ML IJ SOLN
4.0000 mg | Freq: Four times a day (QID) | INTRAMUSCULAR | Status: DC | PRN
  Administered 2014-12-19: 4 mg via INTRAVENOUS
  Filled 2014-12-15: qty 2

## 2014-12-15 MED ORDER — SODIUM CHLORIDE 0.9 % IJ SOLN
3.0000 mL | Freq: Two times a day (BID) | INTRAMUSCULAR | Status: DC
  Administered 2014-12-15 – 2014-12-23 (×11): 3 mL via INTRAVENOUS

## 2014-12-15 MED ORDER — ONDANSETRON HCL 4 MG PO TABS: 4.0000 mg | ORAL_TABLET | Freq: Four times a day (QID) | ORAL | Status: DC | PRN

## 2014-12-15 MED ORDER — SODIUM CHLORIDE 0.9 % IV SOLN
INTRAVENOUS | Status: DC
  Administered 2014-12-17: 03:00:00 via INTRAVENOUS

## 2014-12-15 MED ORDER — NALOXONE HCL 0.4 MG/ML IJ SOLN
0.2000 mg | Freq: Once | INTRAMUSCULAR | Status: AC
  Administered 2014-12-15: 0.2 mg via INTRAVENOUS
  Filled 2014-12-15: qty 1

## 2014-12-15 MED ORDER — TRAMADOL HCL 50 MG PO TABS
25.0000 mg | ORAL_TABLET | Freq: Four times a day (QID) | ORAL | Status: DC | PRN
  Administered 2014-12-16 – 2014-12-18 (×4): 25 mg via ORAL
  Filled 2014-12-15 (×4): qty 1

## 2014-12-15 MED ORDER — NALOXONE HCL 0.4 MG/ML IJ SOLN
INTRAMUSCULAR | Status: AC
  Filled 2014-12-15: qty 1

## 2014-12-15 MED ORDER — ALUM & MAG HYDROXIDE-SIMETH 200-200-20 MG/5ML PO SUSP: 30.0000 mL | Freq: Four times a day (QID) | ORAL | Status: DC | PRN

## 2014-12-15 NOTE — H&P (Addendum)
History and Physical    Kellie Acosta ZOX:[id number] DOB: 18-Dec-1945 DOA: 12/15/2014  Referring physician: [facility] PCP: No primary care provider on file.  Specialists: none  Chief Complaint: back pain  HPI: Kellie Acosta is a 69 y.o. female has a past medical history significant for hypertension, hyperlipidemia, polypharmacy, prior history of stroke, prior history of paroxysmal A. fib now with a pacemaker, CHF, anxiety, dementia, is being direct admitted from [facility] emergency room with a chief complaint back pain. Patient apparently had laminectomy done by Dr. Loralie Champagne in [place] in June 2016 and since then she has been having a lot of issues with her back pain, lower extremity weakness and inability to walk without excruciating pain. On admission, patient is quite lethargic, she wakes up and is able to answer basic questions however falls back asleep right away. Per nursing report, she received narcotics medication prior to transfer as well as reportedly methadone. The only thing she complains of is back pain. She denies any chest pain, denies any shortness of breath, she denies any abdominal pain, nausea, vomiting or diarrhea. I was able to talk with her husband over the phone, he confirms that she's been having a lot of back issues and she's been on and off on antibiotics since her surgery in June. Most recently the patient was started on Zyvox for her PCP about a week ago. She had an x-ray of her spine in the ED at [facility] which showed progression of osteomyelitis, and patient was transferred here for further workup. Patient's husband is a poor historian as well, and unable to add more to the story.  In addition, per report from the ED, she takes methadone, Xanax, and tramadol, and all 3 today. The husband stated to the ED physician as well as to me over the phone, that she usually is taking her medications and then sleeps for long periods of time. There are reports that she's  been refusing to walk due to pain as well as refusing physical therapy at the nursing home   In [place] ED, she was afebrile, heart rate 70, blood pressure 106/57 satting 98% on 2 L. She uses oxygen at home. White count in the ED was 6.4, hemoglobin 9.1, platelets 150. We don't have her renal function from today, yesterday she had a BUN of 50 and a creatinine of 1.1.  Review of Systems:  As per history of present illness, otherwise 10 point review of systems negative  Past Medical History  Diagnosis Date  . PAF (paroxysmal atrial fibrillation) (HCC)     pacemaker  . HTN (hypertension)   . Syncope   . Polypharmacy   . Myocardial infarction (HCC)   . Presence of permanent cardiac pacemaker   . Stroke (HCC)   . CHF (congestive heart failure) (HCC)   . Shortness of breath dyspnea   . Diabetes mellitus without complication (HCC)   . Depression   . Dementia   . Anxiety   . GERD (gastroesophageal reflux disease)   . Arthritis   . Fibromyalgia    Past Surgical History  Procedure Laterality Date  . Insert / replace / remove pacemaker    . Back surgery  06/2014,07/2014    LUMBAR LAMINECTOMY L4-L5  . Appendectomy    . Abdominal hysterectomy     Social History:  reports that she has never smoked. She does not have any smokeless tobacco history on file. She reports that she does not drink alcohol or use illicit drugs.  Allergies  Allergen Reactions  . Penicillins Itching    Family History  Problem Relation Age of Onset  . Heart disease    . Cancer    . Heart disease Mother   . Heart disease Father   . Heart disease Sister   . Heart disease Brother     Prior to Admission medications   Medication Sig Start Date End Date Taking? Authorizing Provider  ALPRAZolam Prudy Feeler(XANAX) 1 MG tablet Take 1 mg by mouth 2 (two) times daily as needed.      Historical Provider, MD  amLODipine (NORVASC) 10 MG tablet Take 10 mg by mouth daily.      Historical Provider, MD  carisoprodol (SOMA) 350 MG  tablet Take 350 mg by mouth 3 (three) times daily as needed.      Historical Provider, MD  dicyclomine (BENTYL) 10 MG capsule Take 10 mg by mouth 4 (four) times daily -  before meals and at bedtime.      Historical Provider, MD  donepezil (ARICEPT) 5 MG tablet Take 5 mg by mouth at bedtime.      Historical Provider, MD  escitalopram (LEXAPRO) 10 MG tablet Take 10 mg by mouth daily.      Historical Provider, MD  fexofenadine (ALLEGRA) 180 MG tablet Take 180 mg by mouth daily.      Historical Provider, MD  furosemide (LASIX) 80 MG tablet Take 80 mg by mouth 2 (two) times daily.      Historical Provider, MD  gabapentin (NEURONTIN) 600 MG tablet Take 600 mg by mouth 2 (two) times daily.      Historical Provider, MD  HYDROcodone-acetaminophen (VICODIN) 5-500 MG per tablet Take 1 tablet by mouth every 6 (six) hours as needed.      Historical Provider, MD  indomethacin (INDOCIN) 50 MG capsule Take 50 mg by mouth 3 (three) times daily with meals.      Historical Provider, MD  lidocaine (LIDODERM) 5 % Place 1 patch onto the skin as directed. Remove & Discard patch within 12 hours or as directed by MD     Historical Provider, MD  loratadine (CLARITIN) 10 MG tablet Take 10 mg by mouth daily.      Historical Provider, MD  meclizine (ANTIVERT) 25 MG tablet Take 25 mg by mouth 3 (three) times daily as needed.      Historical Provider, MD  meloxicam (MOBIC) 15 MG tablet Take 15 mg by mouth daily.      Historical Provider, MD  metoprolol (LOPRESSOR) 100 MG tablet Take 100 mg by mouth 2 (two) times daily.      Historical Provider, MD  metroNIDAZOLE (FLAGYL) 250 MG tablet Take 250 mg by mouth 3 (three) times daily.      Historical Provider, MD  Milnacipran (SAVELLA) 50 MG TABS Take 50 mg by mouth 2 (two) times daily.      Historical Provider, MD  mometasone (NASONEX) 50 MCG/ACT nasal spray Place 2 sprays into the nose daily.      Historical Provider, MD  nabumetone (RELAFEN) 750 MG tablet Take 750 mg by mouth 2 (two)  times daily.      Historical Provider, MD  nitroGLYCERIN (NITROSTAT) 0.4 MG SL tablet Place 0.4 mg under the tongue every 5 (five) minutes as needed.      Historical Provider, MD  omeprazole (PRILOSEC) 40 MG capsule Take 40 mg by mouth 2 (two) times daily.      Historical Provider, MD  potassium chloride SA (K-DUR,KLOR-CON) 20 MEQ  tablet Take 20 mEq by mouth 3 (three) times daily.      Historical Provider, MD  pregabalin (LYRICA) 300 MG capsule Take 300 mg by mouth 3 (three) times daily.      Historical Provider, MD  promethazine (PHENERGAN) 25 MG tablet Take 25 mg by mouth every 6 (six) hours as needed.      Historical Provider, MD  rOPINIRole (REQUIP) 1 MG tablet Take 3 mg by mouth at bedtime.      Historical Provider, MD  rosuvastatin (CRESTOR) 20 MG tablet Take 20 mg by mouth daily.      Historical Provider, MD  spironolactone (ALDACTONE) 25 MG tablet Take 25 mg by mouth daily.      Historical Provider, MD  tiotropium (SPIRIVA) 18 MCG inhalation capsule Place 18 mcg into inhaler and inhale daily.      Historical Provider, MD  tolterodine (DETROL LA) 4 MG 24 hr capsule Take 4 mg by mouth daily.      Historical Provider, MD  traMADol (ULTRAM) 50 MG tablet Take 50 mg by mouth every 6 (six) hours as needed.      Historical Provider, MD  trimethoprim (TRIMPEX) 100 MG tablet Take 100 mg by mouth 2 (two) times daily.      Historical Provider, MD  valsartan (DIOVAN) 320 MG tablet Take 320 mg by mouth daily.      Historical Provider, MD  venlafaxine (EFFEXOR) 75 MG tablet Take 75 mg by mouth 2 (two) times daily.      Historical Provider, MD  zolpidem (AMBIEN) 10 MG tablet Take 10 mg by mouth at bedtime as needed.      Historical Provider, MD   Physical Exam: Filed Vitals:   12/15/14 1803  BP: 83/46  Pulse: 79  Temp: 97.9 F (36.6 C)  TempSrc: Axillary  Resp: 17  SpO2: 100%     GENERAL: she is drowsy, wakes up when prompted and falls back asleep. Has oxygen on  HEENT: head NCAT, no scleral  icterus. Pupils round and reactive. Mucous membranes are moist. Posterior pharynx clear of any exudate or lesions.  NECK: Supple. No carotid bruits.   LUNGS: Clear to auscultation. No wheezing or crackles  HEART: Regular rate and rhythm without murmur. 2+ pulses, no JVD, no peripheral edema  ABDOMEN: Soft, nontender, and nondistended. Positive bowel sounds.  EXTREMITIES: Without any cyanosis, clubbing, rash, lesions or edema.  NEUROLOGIC: Alert and oriented x1-2. Neuro exam non focal, strength decreased but equal in LE  SKIN: No ulceration or induration present. Surgical site on L spine well healed, no drainage   Labs on Admission:  Basic Metabolic Panel: No results for input(s): NA, K, CL, CO2, GLUCOSE, BUN, CREATININE, CALCIUM, MG, PHOS in the last 168 hours. Liver Function Tests: No results for input(s): AST, ALT, ALKPHOS, BILITOT, PROT, ALBUMIN in the last 168 hours. No results for input(s): LIPASE, AMYLASE in the last 168 hours. No results for input(s): AMMONIA in the last 168 hours. CBC: No results for input(s): WBC, NEUTROABS, HGB, HCT, MCV, PLT in the last 168 hours. Cardiac Enzymes: No results for input(s): CKTOTAL, CKMB, CKMBINDEX, TROPONINI in the last 168 hours.  BNP (last 3 results) No results for input(s): BNP in the last 8760 hours.  ProBNP (last 3 results) No results for input(s): PROBNP in the last 8760 hours.  CBG:  Recent Labs Lab 12/15/14 1743  GLUCAP 114*    Radiological Exams on Admission: No results found.  EKG:  Pending at the time of  admission  Assessment/Plan Active Problems:   Discitis   COPD (chronic obstructive pulmonary disease) (HCC)   HTN (hypertension)   CAD (coronary artery disease)   Pacemaker   Anxiety state   Arthritis   Diabetes (HCC)   Discitis / Osteomyelitis - Patient will need a CT scan of the L-spine area, obtain renal function first see she hasn't had renal function done today, will sign out to the night team to  review labs - We'll likely need an ID consult in the morning as well as IR to sample of tissue - She is not febrile, will hold antibiotics for now to maximize the yield if she is to undergo an aspirate  - obtain sed rate and CRP  Lethargy - Likely due to narcotic medication prior to transfer, she is also mildly hypotensive due to that, we'll provide IV fluids, I will give patient Narcan - There is a history of polypharmacy, and it is obvious from her home medication list - Per notes in the ED that arrived with patient, "family states that she stays in a state of narcosis all the time" - We'll provide lower dose xanax that she normally takes starting tomorrow, as well as low-dose tramadol for pain. Avoid sedating agents tonight. Pharmacy to reconcile her meds  History of paroxysmal A. fib status post pacemaker - CHADS VASC score 7, she does not appear on anticoagulation based on current medication list, will ask pharmacy to reconcile the medications.   COPD - Appears to be on chronic oxygen, she has no wheezing, no evidence of exacerbation  Coronary artery disease - With prior history of MI, currently denies any chest pain, closely monitor  Anxiety/chronic pain - Patient on multiple medications at home it appears that she is on methadone 5 mg 3 times a day as well as Xanax, she is on trazodone, she is on Lyrica, she is on tramadol.  Pacemaker - Monitor on telemetry  Hypertension - Given initial low blood pressure, we'll hold all home antihypertensives, start IV fluids and recheck vital signs in an hour  History of diabetes mellitus - Place patient on sliding scale insulin, obtain hemoglobin A1c  Reported history of CHF - Per records, most recent echo earlier this year with normal ejection fraction and no diastolic dysfunction  Diet: will keep nothing by mouth with her current mental status Fluids: start normal saline DVT Prophylaxis: Lovenox  Code Status: presumed full code    Family Communication: discussed with the husband over the phone  Disposition Plan: admit to inpatient  Time spent coordinating care: 75 minutes, greater than 50% of this time was spent in counseling, explanation of diagnosis, planning of further management,  and detailed review of her medical history, paperwork from [facility] M. Elvera Lennox, MD [facility] Pager [phone]  If 7PM-7AM, please contact night-coverage [email or web address] Password [facility] 12/15/2014, 6:17 PM

## 2014-12-15 NOTE — Plan of Care (Signed)
Referring M.D. Dr. Littie DeedsGentry  Patient is a 69 year old female with hypertension, paroxysmal A. Fib, chronic back pain, from a skilled nursing facility, pacemaker, had laminectomy done by Dr. Loralie [place]urrani [place] in June 2016. Since then patient has been having issues with the back pain, decreased ability to walk, increasing back pain. Patient had CT scan done in October which raised the possibility of discitis (it was not treated). Patient continued to have worsening symptoms and was seen by her PCP in November when she had confusion and UTI. It appears that patient's PCP started her on oral Zyvox on 11/28 for presumed discitis. Per EDP Dr Littie DeedsGentry, patient has no neurological deficits, no urinary retention however is unable to bear weight or ambulate. X-ray spine showed progression of osteomyelitis. Dr. Littie DeedsGentry requested patient to be transferred per family's request for further workup and ID consult.   Vital signs currently stable.  Patient has a pacemaker, will not be able to obtain MRI of the spine. Will likely need to repeat the CT spine and ID consult. Accepted to tele.    RAI,RIPUDEEP M.D. [facility] 12/15/2014, 1:32 PM  Pager: [phone]

## 2014-12-15 NOTE — Progress Notes (Signed)
New Admission Note:   Arrival Method: carelink Mental Orientation: A/O x3 Telemetry: will be placed Assessment: Completed Skin:skin is pale and dry. Has a would over her sacrum covered by foam IV: no IV access Pain: patient lethargic upon arrival Tubes: none Safety Measures: Safety Fall Prevention Plan has been given, discussed and signed Admission: Completed Unit Orientation: Patient has been orientated to the room, unit and staff.  Family: no family present  Orders have been reviewed and implemented. Will continue to monitor the patient. Call light has been placed within reach and bed alarm has been activated.   Janeann ForehandLuke Denessa Cavan BSN, RN

## 2014-12-16 ENCOUNTER — Inpatient Hospital Stay (HOSPITAL_COMMUNITY): Payer: Medicare Other

## 2014-12-16 ENCOUNTER — Encounter (HOSPITAL_COMMUNITY): Payer: Self-pay | Admitting: Radiology

## 2014-12-16 DIAGNOSIS — L8990 Pressure ulcer of unspecified site, unspecified stage: Secondary | ICD-10-CM | POA: Insufficient documentation

## 2014-12-16 DIAGNOSIS — M4626 Osteomyelitis of vertebra, lumbar region: Principal | ICD-10-CM

## 2014-12-16 DIAGNOSIS — M4646 Discitis, unspecified, lumbar region: Secondary | ICD-10-CM

## 2014-12-16 DIAGNOSIS — N179 Acute kidney failure, unspecified: Secondary | ICD-10-CM

## 2014-12-16 LAB — CBC
HCT: 25.9 % — ABNORMAL LOW (ref 36.0–46.0)
Hemoglobin: 7.8 g/dL — ABNORMAL LOW (ref 12.0–15.0)
MCH: 25.9 pg — ABNORMAL LOW (ref 26.0–34.0)
MCHC: 30.1 g/dL (ref 30.0–36.0)
MCV: 86 fL (ref 78.0–100.0)
Platelets: 134 10*3/uL — ABNORMAL LOW (ref 150–400)
RBC: 3.01 MIL/uL — ABNORMAL LOW (ref 3.87–5.11)
RDW: 17.7 % — ABNORMAL HIGH (ref 11.5–15.5)
WBC: 12.5 10*3/uL — ABNORMAL HIGH (ref 4.0–10.5)

## 2014-12-16 LAB — BLOOD GAS, ARTERIAL
Acid-Base Excess: 0.4 mmol/L (ref 0.0–2.0)
Bicarbonate: 25.7 mEq/L — ABNORMAL HIGH (ref 20.0–24.0)
Drawn by: 257701
O2 Content: 2 L/min
O2 Saturation: 97.1 %
Patient temperature: 98.6
TCO2: 27.2 mmol/L (ref 0–100)
pCO2 arterial: 50.6 mmHg — ABNORMAL HIGH (ref 35.0–45.0)
pH, Arterial: 7.326 — ABNORMAL LOW (ref 7.350–7.450)
pO2, Arterial: 93 mmHg (ref 80.0–100.0)

## 2014-12-16 LAB — URINALYSIS, ROUTINE W REFLEX MICROSCOPIC
Bilirubin Urine: NEGATIVE
GLUCOSE, UA: NEGATIVE mg/dL
Ketones, ur: NEGATIVE mg/dL
NITRITE: NEGATIVE
PROTEIN: 30 mg/dL — AB
Specific Gravity, Urine: 1.014 (ref 1.005–1.030)
pH: 5 (ref 5.0–8.0)

## 2014-12-16 LAB — RETICULOCYTES
RBC.: 2.93 MIL/uL — ABNORMAL LOW (ref 3.87–5.11)
Retic Count, Absolute: 49.8 10*3/uL (ref 19.0–186.0)
Retic Ct Pct: 1.7 % (ref 0.4–3.1)

## 2014-12-16 LAB — BASIC METABOLIC PANEL
Anion gap: 7 (ref 5–15)
BUN: 39 mg/dL — ABNORMAL HIGH (ref 6–20)
CO2: 28 mmol/L (ref 22–32)
Calcium: 8.5 mg/dL — ABNORMAL LOW (ref 8.9–10.3)
Chloride: 108 mmol/L (ref 101–111)
Creatinine, Ser: 1.48 mg/dL — ABNORMAL HIGH (ref 0.44–1.00)
GFR calc Af Amer: 40 mL/min — ABNORMAL LOW (ref >60)
GFR calc non Af Amer: 34 mL/min — ABNORMAL LOW (ref >60)
Glucose, Bld: 91 mg/dL (ref 65–99)
Potassium: 3.3 mmol/L — ABNORMAL LOW (ref 3.5–5.1)
Sodium: 143 mmol/L (ref 135–145)

## 2014-12-16 LAB — URINE MICROSCOPIC-ADD ON

## 2014-12-16 LAB — IRON AND TIBC
Iron: 45 ug/dL (ref 28–170)
Saturation Ratios: 24 % (ref 10.4–31.8)
TIBC: 188 ug/dL — ABNORMAL LOW (ref 250–450)
UIBC: 143 ug/dL

## 2014-12-16 LAB — FERRITIN: Ferritin: 202 ng/mL (ref 11–307)

## 2014-12-16 LAB — HEMOGLOBIN A1C
Hgb A1c MFr Bld: 6.1 % — ABNORMAL HIGH (ref 4.8–5.6)
Mean Plasma Glucose: 128 mg/dL

## 2014-12-16 LAB — VITAMIN B12: VITAMIN B 12: 775 pg/mL (ref 180–914)

## 2014-12-16 LAB — GLUCOSE, CAPILLARY
GLUCOSE-CAPILLARY: 101 mg/dL — AB (ref 65–99)
Glucose-Capillary: 78 mg/dL (ref 65–99)
Glucose-Capillary: 74 mg/dL (ref 65–99)
Glucose-Capillary: 73 mg/dL (ref 65–99)

## 2014-12-16 LAB — FOLATE: Folate: 7.7 ng/mL (ref >5.9)

## 2014-12-16 MED ORDER — LORAZEPAM 2 MG/ML IJ SOLN
1.0000 mg | INTRAMUSCULAR | Status: DC | PRN
  Administered 2014-12-16 – 2014-12-19 (×5): 1 mg via INTRAVENOUS
  Filled 2014-12-16 (×5): qty 1

## 2014-12-16 MED ORDER — KETOROLAC TROMETHAMINE 30 MG/ML IJ SOLN
30.0000 mg | Freq: Once | INTRAMUSCULAR | Status: AC
  Administered 2014-12-16: 30 mg via INTRAVENOUS
  Filled 2014-12-16: qty 1

## 2014-12-16 MED ORDER — ENOXAPARIN SODIUM 40 MG/0.4ML ~~LOC~~ SOLN
40.0000 mg | SUBCUTANEOUS | Status: DC
  Filled 2014-12-16: qty 0.4

## 2014-12-16 MED ORDER — CETYLPYRIDINIUM CHLORIDE 0.05 % MT LIQD
7.0000 mL | Freq: Two times a day (BID) | OROMUCOSAL | Status: DC
  Administered 2014-12-16 – 2014-12-24 (×15): 7 mL via OROMUCOSAL

## 2014-12-16 MED ORDER — IOHEXOL 300 MG/ML  SOLN
60.0000 mL | Freq: Once | INTRAMUSCULAR | Status: AC | PRN
  Administered 2014-12-16: 60 mL via INTRAVENOUS

## 2014-12-16 NOTE — Progress Notes (Signed)
Initial Nutrition Assessment  DOCUMENTATION CODES:   Not applicable  INTERVENTION:  Diet advancement as appropriate.   Recommend obtaining height measurement.   RD to continue to monitor.   NUTRITION DIAGNOSIS:   Increased nutrient needs related to wound healing as evidenced by estimated needs.  GOAL:   Patient will meet greater than or equal to 90% of their needs  MONITOR:   Diet advancement, Skin, Weight trends, Labs, I & O's  REASON FOR ASSESSMENT:   Malnutrition Screening Tool    ASSESSMENT:   69 y.o. female has a past medical history significant for hypertension, hyperlipidemia, polypharmacy, prior history of stroke, prior history of paroxysmal A. fib now with a pacemaker, CHF, anxiety, dementia, is being direct admitted [facility] room with a chief complaint back pain.   Pt has been agitated. During time of visit pt was asleep. When RD attempted to awaken pt, she became upset and started to cry. No family at bedside. RD unable to obtain nutrition history. Limited nutrition-focused physical exam completed which revealed no significant fat or muscle mass loss. Lower extremities were not observed.  Labs and medications reviewed.   Diet Order:  Diet NPO time specified Except for: Ice Chips, Sips with Meds  Skin:  Wound (see comment) (Unstg ulcer on L heel)  Last BM:  12/7  Height:   Ht Readings from Last 1 Encounters:  No data found for Ht    Weight:   Wt Readings from Last 1 Encounters:  12/15/14 186 lb 4.6 oz (84.5 kg)    BMI:  There is no height on file to calculate BMI.  Estimated Nutritional Needs:   Kcal:  1900-2100  Protein:  85-105 grams  Fluid:  1.9 - 2.1 L/day  EDUCATION NEEDS:   Education needs no appropriate at this time  Roslyn SmilingStephanie Mikhail Hallenbeck, MS, RD, LDN Pager # [phone] After hours/ weekend pager # [phone]

## 2014-12-16 NOTE — Progress Notes (Signed)
Call received per Bed Placement at 1915 concerning limited SDU beds this evening, Patient for SDU bed per order Dr. Elvera LennoxGherghe. Pt seen and VS obtained. HR 102 BP 99/45, RR 15, Po2 96% on 2 LNC. Protecting airway well. Pt arouses to loud voice, oriented to self and location. Follows simple commands, falls asleep easily. Once asleep Po2 drops to 87%, oxygen increased to 4 LNC with po2 sats improving to 95-97%. Attempted to start an IV x 1 unsuccessfully, IV team called. Bedside RN and Charge Rn updated on limited SDU beds, advised to check VS q4 for now and reassess VS once IV obtained and Narcan given as ordered.   2200 Update: New IV obtained and Pt given Narcan. VSS, Pt asleep but easily aroused to voice. RN advised to monitor closely, Pt for CT scan soon. RN to update primary MD of Pt status.

## 2014-12-16 NOTE — Consult Note (Addendum)
[facility]  Date of Admission:  12/15/2014  Date of Consult:  12/16/2014  Reason for Consult: Osteomyelitis, Discitis Referring Physician: Allyson Sabal  Impression/Recommendation Osteomyelitis, Discitis Would hold anbx if we can, as her clinical course allows.  Have neurosurgery or IR evaluate her for Bx/Cx.   Mental Status change Not clear if from medications or infection  ARF Her Cr is elevated, her baseline is unclear.  Will watch.   Thank you so much for this interesting consult,   Bobby Rumpf (pager) [phone] [email or web address]  Kellie Acosta is an 69 y.o. female.  HPI: 69 yo F with hx of CVA, HTN, pacer, hyperlipidemia who had L4-5 laminectomy June and July 2016 in [place]. Since then she has had worsening back pain, culminating in her inability to walk as well as becoming lethargic.  She had been treated with anbx by her PCP, most recently zyvox.  She also has been taking methadone, xanax and tramadol with extended periods of lethargy from these.  She was afebrile in ED at [facility] 12-5, normal WBC. Her BCx from there are ngtd.  Her CT spine shows worsening of discitis/osteo L4-5.   Her husband states she has been on anbx since her surgery.  He knows that she is allergic to PEN but does not know what type of reaction she has.   Past Medical History  Diagnosis Date  . PAF (paroxysmal atrial fibrillation) ([place])     pacemaker  . HTN (hypertension)   . Syncope   . Polypharmacy   . Myocardial infarction ([place])   . Presence of permanent cardiac pacemaker   . Stroke ([place])   . CHF (congestive heart failure) ([place])   . Shortness of breath dyspnea   . Diabetes mellitus without complication ([place])   . Depression   . Dementia   . Anxiety   . GERD (gastroesophageal reflux disease)   . Arthritis   . Fibromyalgia     Past Surgical History  Procedure Laterality Date  . Insert / replace / remove pacemaker    . Back surgery   06/2014,07/2014    LUMBAR LAMINECTOMY L4-L5  . Appendectomy    . Abdominal hysterectomy       Allergies  Allergen Reactions  . Codeine   . Meperidine And Related   . Penicillins Itching  . Pentazocine     Medications:  Scheduled: . antiseptic oral rinse  7 mL Mouth Rinse BID  . enoxaparin (LOVENOX) injection  40 mg Subcutaneous Q24H  . insulin aspart  0-9 Units Subcutaneous TID WC  . sodium chloride  3 mL Intravenous Q12H    Abtx:  Anti-infectives    None      Total days of antibiotics: 0          Social History:  reports that she has never smoked. She does not have any smokeless tobacco history on file. She reports that she does not drink alcohol or use illicit drugs.  Family History  Problem Relation Age of Onset  . Heart disease    . Cancer    . Heart disease Mother   . Heart disease Father   . Heart disease Sister   . Heart disease Brother     General ROS: confused. +fever at home. normal BM, urine.   Blood pressure 101/42, pulse 79, temperature 98.1 F (36.7 C), temperature source Axillary, resp. rate 15, weight 84.5 kg (186 lb 4.6 oz), SpO2 100 %. General appearance: alert, cooperative, no distress and  slowed mentation Eyes: negative findings: conjunctivae and sclerae normal and pupils equal, round, reactive to light and accomodation Throat: normal findings: oropharynx pink & moist without lesions or evidence of thrush Back: tenderness in lumbar area Lungs: clear to auscultation bilaterally Heart: regular rate and rhythm Abdomen: normal findings: bowel sounds normal and soft, non-tender Extremities: edema non-pitting Neurologic: Mental status: orientation: she does not know where she is. she knows the month and year. does not know president.    Results for orders placed or performed during the hospital encounter of 12/15/14 (from the past 48 hour(s))  Glucose, capillary     Status: Abnormal   Collection Time: 12/15/14  5:43 PM  Result Value Ref Range     Glucose-Capillary 114 (H) 65 - 99 mg/dL  Comprehensive metabolic panel     Status: Abnormal   Collection Time: 12/15/14  7:10 PM  Result Value Ref Range   Sodium 143 135 - 145 mmol/L   Potassium 3.6 3.5 - 5.1 mmol/L   Chloride 107 101 - 111 mmol/L   CO2 25 22 - 32 mmol/L   Glucose, Bld 114 (H) 65 - 99 mg/dL   BUN 38 (H) 6 - 20 mg/dL   Creatinine, Ser 1.34 (H) 0.44 - 1.00 mg/dL   Calcium 8.9 8.9 - 10.3 mg/dL   Total Protein 5.8 (L) 6.5 - 8.1 g/dL   Albumin 2.5 (L) 3.5 - 5.0 g/dL   AST 28 15 - 41 U/L   ALT 19 14 - 54 U/L   Alkaline Phosphatase 76 38 - 126 U/L   Total Bilirubin 0.5 0.3 - 1.2 mg/dL   GFR calc non Af Amer 39 (L) >60 mL/min   GFR calc Af Amer 45 (L) >60 mL/min    Comment: (NOTE) The eGFR has been calculated using the CKD EPI equation. This calculation has not been validated in all clinical situations. eGFR's persistently <60 mL/min signify possible Chronic Kidney Disease.    Anion gap 11 5 - 15  CBC WITH DIFFERENTIAL     Status: Abnormal   Collection Time: 12/15/14  7:10 PM  Result Value Ref Range   WBC 13.5 (H) 4.0 - 10.5 K/uL   RBC 4.04 3.87 - 5.11 MIL/uL   Hemoglobin 10.5 (L) 12.0 - 15.0 g/dL   HCT 35.5 (L) 36.0 - 46.0 %   MCV 87.9 78.0 - 100.0 fL   MCH 26.0 26.0 - 34.0 pg   MCHC 29.6 (L) 30.0 - 36.0 g/dL   RDW 17.8 (H) 11.5 - 15.5 %   Platelets 206 150 - 400 K/uL   Neutrophils Relative % 79 %   Neutro Abs 10.6 (H) 1.7 - 7.7 K/uL   Lymphocytes Relative 12 %   Lymphs Abs 1.6 0.7 - 4.0 K/uL   Monocytes Relative 4 %   Monocytes Absolute 0.6 0.1 - 1.0 K/uL   Eosinophils Relative 5 %   Eosinophils Absolute 0.7 0.0 - 0.7 K/uL   Basophils Relative 0 %   Basophils Absolute 0.0 0.0 - 0.1 K/uL  Protime-INR     Status: Abnormal   Collection Time: 12/15/14  7:10 PM  Result Value Ref Range   Prothrombin Time 15.6 (H) 11.6 - 15.2 seconds   INR 1.22 0.00 - 1.49  TSH     Status: None   Collection Time: 12/15/14  7:10 PM  Result Value Ref Range   TSH 2.346  0.350 - 4.500 uIU/mL  Hemoglobin A1c     Status: Abnormal   Collection Time: 12/15/14  7:10 PM  Result Value Ref Range   Hgb A1c MFr Bld 6.1 (H) 4.8 - 5.6 %    Comment: (NOTE)         Pre-diabetes: 5.7 - 6.4         Diabetes: >6.4         Glycemic control for adults with diabetes: <7.0    Mean Plasma Glucose 128 mg/dL    Comment: (NOTE) Performed At: [facility] [place], [place] [id number] Lindon Romp MD GO:[id number]   Sedimentation rate     Status: Abnormal   Collection Time: 12/15/14  7:10 PM  Result Value Ref Range   Sed Rate 65 (H) 0 - 22 mm/hr  C-reactive protein     Status: Abnormal   Collection Time: 12/15/14  7:10 PM  Result Value Ref Range   CRP 1.6 (H) <1.0 mg/dL  Glucose, capillary     Status: None   Collection Time: 12/15/14  9:55 PM  Result Value Ref Range   Glucose-Capillary 92 65 - 99 mg/dL  Urinalysis, Routine w reflex microscopic (not at [facility])     Status: Abnormal   Collection Time: 12/16/14  2:05 AM  Result Value Ref Range   Color, Urine YELLOW YELLOW   APPearance CLOUDY (A) CLEAR   Specific Gravity, Urine 1.014 1.005 - 1.030   pH 5.0 5.0 - 8.0   Glucose, UA NEGATIVE NEGATIVE mg/dL   Hgb urine dipstick LARGE (A) NEGATIVE   Bilirubin Urine NEGATIVE NEGATIVE   Ketones, ur NEGATIVE NEGATIVE mg/dL   Protein, ur 30 (A) NEGATIVE mg/dL   Nitrite NEGATIVE NEGATIVE   Leukocytes, UA TRACE (A) NEGATIVE  Urine microscopic-add on     Status: Abnormal   Collection Time: 12/16/14  2:05 AM  Result Value Ref Range   Squamous Epithelial / LPF 6-30 (A) NONE SEEN   WBC, UA 6-30 0 - 5 WBC/hpf   RBC / HPF 6-30 0 - 5 RBC/hpf   Bacteria, UA FEW (A) NONE SEEN   Casts HYALINE CASTS (A) NEGATIVE   Urine-Other YEAST PRESENT   Basic metabolic panel     Status: Abnormal   Collection Time: 12/16/14  5:25 AM  Result Value Ref Range   Sodium 143 135 - 145 mmol/L   Potassium 3.3 (L) 3.5 - 5.1 mmol/L   Chloride 108 101 - 111 mmol/L   CO2 28 22 -  32 mmol/L   Glucose, Bld 91 65 - 99 mg/dL   BUN 39 (H) 6 - 20 mg/dL   Creatinine, Ser 1.48 (H) 0.44 - 1.00 mg/dL   Calcium 8.5 (L) 8.9 - 10.3 mg/dL   GFR calc non Af Amer 34 (L) >60 mL/min   GFR calc Af Amer 40 (L) >60 mL/min    Comment: (NOTE) The eGFR has been calculated using the CKD EPI equation. This calculation has not been validated in all clinical situations. eGFR's persistently <60 mL/min signify possible Chronic Kidney Disease.    Anion gap 7 5 - 15  CBC     Status: Abnormal   Collection Time: 12/16/14  5:25 AM  Result Value Ref Range   WBC 12.5 (H) 4.0 - 10.5 K/uL   RBC 3.01 (L) 3.87 - 5.11 MIL/uL   Hemoglobin 7.8 (L) 12.0 - 15.0 g/dL    Comment: REPEATED TO VERIFY   HCT 25.9 (L) 36.0 - 46.0 %   MCV 86.0 78.0 - 100.0 fL   MCH 25.9 (L) 26.0 - 34.0 pg  MCHC 30.1 30.0 - 36.0 g/dL   RDW 17.7 (H) 11.5 - 15.5 %   Platelets 134 (L) 150 - 400 K/uL  Glucose, capillary     Status: None   Collection Time: 12/16/14  8:09 AM  Result Value Ref Range   Glucose-Capillary 78 65 - 99 mg/dL  Vitamin B12     Status: None   Collection Time: 12/16/14 11:13 AM  Result Value Ref Range   Vitamin B-12 775 180 - 914 pg/mL    Comment: (NOTE) This assay is not validated for testing neonatal or myeloproliferative syndrome specimens for Vitamin B12 levels.   Folate     Status: None   Collection Time: 12/16/14 11:13 AM  Result Value Ref Range   Folate 7.7 >5.9 ng/mL  Iron and TIBC     Status: Abnormal   Collection Time: 12/16/14 11:13 AM  Result Value Ref Range   Iron 45 28 - 170 ug/dL   TIBC 188 (L) 250 - 450 ug/dL   Saturation Ratios 24 10.4 - 31.8 %   UIBC 143 ug/dL  Ferritin     Status: None   Collection Time: 12/16/14 11:13 AM  Result Value Ref Range   Ferritin 202 11 - 307 ng/mL  Reticulocytes     Status: Abnormal   Collection Time: 12/16/14 11:13 AM  Result Value Ref Range   Retic Ct Pct 1.7 0.4 - 3.1 %   RBC. 2.93 (L) 3.87 - 5.11 MIL/uL   Retic Count, Manual 49.8 19.0 -  186.0 K/uL  Blood gas, arterial     Status: Abnormal   Collection Time: 12/16/14  1:30 PM  Result Value Ref Range   O2 Content 2.0 L/min   pH, Arterial 7.326 (L) 7.350 - 7.450   pCO2 arterial 50.6 (H) 35.0 - 45.0 mmHg   pO2, Arterial 93.0 80.0 - 100.0 mmHg   Bicarbonate 25.7 (H) 20.0 - 24.0 mEq/L   TCO2 27.2 0 - 100 mmol/L   Acid-Base Excess 0.4 0.0 - 2.0 mmol/L   O2 Saturation 97.1 %   Patient temperature 98.6    Collection site REVIEWED BY    Drawn by [id number]    Sample type ARTERIAL DRAW    Allens test (pass/fail) PASS PASS   No results found for: SDES, SPECREQUEST, CULT, REPTSTATUS Ct Lumbar Spine W Contrast  12/16/2014  CLINICAL DATA:  Personal history of previous laminectomy. Low back pain without described radiation. Previous diagnosis of discitis at L4-5. Unable to have MRI because of pacemaker. EXAM: CT LUMBAR SPINE WITH CONTRAST TECHNIQUE: Multidetector CT imaging of the lumbar spine was performed with intravenous contrast administration. Multiplanar CT image reconstructions were also generated. CONTRAST:  60 cc Isovue-300 COMPARISON:  10/20/2014.  10/07/2014.  08/10/2014. FINDINGS: No changes significant finding at L3-4 or above. At L4-5, there is further worsening of erosive destruction of the vertebral bodies inferiorly at L4 and superiorly at L5 consistent with discitis osteomyelitis. There has been previous left hemilaminectomy. There is some residual bulging disc material. There is foraminal encroachment bilaterally by disc material, destroyed bone and inflammatory material, worse on the left than the right. No significant para spinous or psoas changes. L5-S1: Disc degeneration with endplate osteophytes and bulging of the disc. Facet degeneration. Mild narrowing of the lateral recesses and foramina without definite neural compression. IMPRESSION: Further worsening of erosive destructive changes at the L4-5 level consistent with persistent and worsened discitis osteomyelitis. Lateral  recess and foraminal encroachment by disc, destroyed bone and inflammatory tissue. Electronically Signed  By: Nelson Chimes M.D.   On: 12/16/2014 12:32   No results found for this or any previous visit (from the past 240 hour(s)).    12/16/2014, 3:01 PM     LOS: 1 day    Records and images were personally reviewed where available.

## 2014-12-16 NOTE — Progress Notes (Signed)
IR PA aware of request for L4-5 disc aspiration. Dr. Corliss Skainseveshwar has reviewed the CT and has approved this procedure for 12/8, IR PA to see patient and obtain consent on 12/8. Orders placed for NPO after midnight and Lovenox to be held.  Pattricia BossKoreen Jarrett [place] PA-C Interventional Radiology  12/16/14 3:51 PM

## 2014-12-16 NOTE — Progress Notes (Signed)
CT came to floor to transfer pt. Pt very agitated with foul language. CT transporter suggests pt can not get CT if she will not be still. Midlevel informed. CT will be attempted tomorrow per midlevel. Will continue to monitor.

## 2014-12-16 NOTE — Progress Notes (Signed)
Gave midlevel update on pt due to orders to be in stepdown. Pt has soft BP's and is very agitated with foul language when awaken. Pt tolerated 14 F foley catheter. Will continue to monitor.

## 2014-12-16 NOTE — Evaluation (Signed)
Clinical/Bedside Swallow Evaluation Patient Details  Name: Kellie Acosta MRN: [id number] Date of Birth: July 04, 1945  Today's Date: 12/16/2014 Time: SLP Start Time (ACUTE ONLY): 1352 SLP Stop Time (ACUTE ONLY): 1407 SLP Time Calculation (min) (ACUTE ONLY): 15 min  Past Medical History:  Past Medical History  Diagnosis Date  . PAF (paroxysmal atrial fibrillation) ([facility])     pacemaker  . HTN (hypertension)   . Syncope   . Polypharmacy   . Myocardial infarction ([facility])   . Presence of permanent cardiac pacemaker   . Stroke ([facility])   . CHF (congestive heart failure) ([facility])   . Shortness of breath dyspnea   . Diabetes mellitus without complication ([facility])   . Depression   . Dementia   . Anxiety   . GERD (gastroesophageal reflux disease)   . Arthritis   . Fibromyalgia    Past Surgical History:  Past Surgical History  Procedure Laterality Date  . Insert / replace / remove pacemaker    . Back surgery  06/2014,07/2014    LUMBAR LAMINECTOMY L4-L5  . Appendectomy    . Abdominal hysterectomy     HPI:  Kellie MamDoris Acosta is a 69 y.o. female admitted for back pain s/p laminectomy  in June 2016. X-ray shows progression of osteomyelitis. PMH: hypertension, hyperlipidemia, polypharmacy, prior history of stroke, prior history of paroxysmal A. fib now with a pacemaker, CHF, anxiety, dementia.   Assessment / Plan / Recommendation Clinical Impression  Pt's swallowing function is most significantly impacted by her lethargy and altered mentation, requiring Max cues for oral acceptance of POs. Once she accepts thin liquids and purees, bolus manipulation and transit appear to be swift and are without overt signs of aspiration. Would initiate Dys 1 diet and thin liquids. Will f/u for tolerance and possible advancement pending improvements in mentation.    Aspiration Risk  Mild aspiration risk    Diet Recommendation  Dys 1 diet, thin liquids Full supervision   Medication Administration: Crushed with puree     Other  Recommendations Oral Care Recommendations: Oral care BID   Follow up Recommendations  Skilled Nursing facility    Frequency and Duration min 2x/week  2 weeks       Prognosis Prognosis for Safe Diet Advancement: Fair Barriers to Reach Goals: Cognitive deficits      Swallow Study   General HPI: Kellie Acosta is a 69 y.o. female admitted for back pain s/p laminectomy  in June 2016. X-ray shows progression of osteomyelitis. PMH: hypertension, hyperlipidemia, polypharmacy, prior history of stroke, prior history of paroxysmal A. fib now with a pacemaker, CHF, anxiety, dementia. Type of Study: Bedside Swallow Evaluation Previous Swallow Assessment: none in chart Diet Prior to this Study: Dysphagia 1 (puree);Thin liquids Temperature Spikes Noted: No Respiratory Status: Nasal cannula History of Recent Intubation: No Behavior/Cognition: Alert;Cooperative;Requires cueing Oral Cavity Assessment: Other (comment) (difficult to visualize, doesn't follow commands consistently) Oral Care Completed by SLP: No Oral Cavity - Dentition: Other (Comment) (difficult to visualize, doesn't follow commands consistently) Self-Feeding Abilities: Needs assist Patient Positioning: Upright in bed Baseline Vocal Quality: Normal    Oral/Motor/Sensory Function Overall Oral Motor/Sensory Function: Other (comment) (appears WFL but doesn't follow commands to assess)   Ice Chips Ice chips: Not tested   Thin Liquid Thin Liquid: Impaired Presentation: Straw Oral Phase Impairments: Poor awareness of bolus    Nectar Thick Nectar Thick Liquid: Not tested   Honey Thick Honey Thick Liquid: Not tested   Puree Puree: Impaired Presentation: Spoon Oral Phase Impairments: Poor awareness  of bolus   Solid Solid: Not tested      Maxcine Ham, M.A. CCC-SLP [phone]  Maxcine Ham 12/16/2014,2:24 PM

## 2014-12-16 NOTE — Progress Notes (Signed)
[facility] PROGRESS NOTE  Kellie Acosta Wire [id number] DOB: Feb 19, 1945 DOA: 12/15/2014 PCP: No primary care provider on file.  Assessment/Plan:                                 Acute on chronic osteomyelitis: Plan for CT lumbar spine today, showed progression and worsening of the osteomyelitis at L4 AND l5.  - IR consulted for aspiration.  - plan for Antibiotics after IR guided aspiration.    Acute encephalopathy: Improved during the later day.  Possibly from medications.   Acute renal failure:  Slightly worsened , continue to monitor.   Drop in hemoglobin / anemia of unclear etiology: - normocytic, get anemia panel, stool for occult blood ordered.   COPD: No wheezing heard.  Resume nebs as needed.   Acute respiratory failure with hypoxia: probably from use of pain meds and sedatives.   Hypokalemia: replete as needed.    Code Status: FULL CODE.  Family Communication: FAMILY AT BEDSIDE.  Disposition Plan: plan for IR aspiration in am.       .    Consultants: IR ID Procedures:  none  Antibiotics:  none  HPI/Subjective: She is more calmer later in the day.  Objective: Filed Vitals:   12/16/14 1321 12/16/14 1706  BP: 101/42 128/56  Pulse: 79 93  Temp: 98.1 F (36.7 C) 97.6 F (36.4 C)  Resp: 15 15    Intake/Output Summary (Last 24 hours) at 12/16/14 1913 Last data filed at 12/16/14 1819  Gross per 24 hour  Intake   1290 ml  Output    250 ml  Net   1040 ml   Filed Weights   12/15/14 2156  Weight: 84.5 kg (186 lb 4.6 oz)    Exam:   General:  Alert afebrile comfortable.   Cardiovascular: s1s2  Respiratory: ctab  Abdomen: soft NT ND bs+  Musculoskeletal: trace pedal edema.   Data Reviewed: Basic Metabolic Panel:  Recent Labs Lab 12/15/14 1910 12/16/14 0525  NA 143 143  K 3.6 3.3*  CL 107 108  CO2 25 28  GLUCOSE 114* 91  BUN 38* 39*  CREATININE 1.34* 1.48*  CALCIUM 8.9 8.5*   Liver Function Tests:  Recent Labs Lab  12/15/14 1910  AST 28  ALT 19  ALKPHOS 76  BILITOT 0.5  PROT 5.8*  ALBUMIN 2.5*   No results for input(s): LIPASE, AMYLASE in the last 168 hours. No results for input(s): AMMONIA in the last 168 hours. CBC:  Recent Labs Lab 12/15/14 1910 12/16/14 0525  WBC 13.5* 12.5*  NEUTROABS 10.6*  --   HGB 10.5* 7.8*  HCT 35.5* 25.9*  MCV 87.9 86.0  PLT 206 134*   Cardiac Enzymes: No results for input(s): CKTOTAL, CKMB, CKMBINDEX, TROPONINI in the last 168 hours. BNP (last 3 results) No results for input(s): BNP in the last 8760 hours.  ProBNP (last 3 results) No results for input(s): PROBNP in the last 8760 hours.  CBG:  Recent Labs Lab 12/15/14 1743 12/15/14 2155 12/16/14 0809 12/16/14 1250 12/16/14 1647  GLUCAP 114* 92 78 74 73    No results found for this or any previous visit (from the past 240 hour(s)).   Studies: Ct Lumbar Spine W Contrast  12/16/2014  CLINICAL DATA:  Personal history of previous laminectomy. Low back pain without described radiation. Previous diagnosis of discitis at L4-5. Unable to have MRI because of pacemaker. EXAM: CT LUMBAR SPINE  WITH CONTRAST TECHNIQUE: Multidetector CT imaging of the lumbar spine was performed with intravenous contrast administration. Multiplanar CT image reconstructions were also generated. CONTRAST:  60 cc Isovue-300 COMPARISON:  10/20/2014.  10/07/2014.  08/10/2014. FINDINGS: No changes significant finding at L3-4 or above. At L4-5, there is further worsening of erosive destruction of the vertebral bodies inferiorly at L4 and superiorly at L5 consistent with discitis osteomyelitis. There has been previous left hemilaminectomy. There is some residual bulging disc material. There is foraminal encroachment bilaterally by disc material, destroyed bone and inflammatory material, worse on the left than the right. No significant para spinous or psoas changes. L5-S1: Disc degeneration with endplate osteophytes and bulging of the disc.  Facet degeneration. Mild narrowing of the lateral recesses and foramina without definite neural compression. IMPRESSION: Further worsening of erosive destructive changes at the L4-5 level consistent with persistent and worsened discitis osteomyelitis. Lateral recess and foraminal encroachment by disc, destroyed bone and inflammatory tissue. Electronically Signed   By: Paulina Fusi M.D.   On: 12/16/2014 12:32    Scheduled Meds: . antiseptic oral rinse  7 mL Mouth Rinse BID  . [START ON 12/18/2014] enoxaparin (LOVENOX) injection  40 mg Subcutaneous Q24H  . insulin aspart  0-9 Units Subcutaneous TID WC  . sodium chloride  3 mL Intravenous Q12H   Continuous Infusions: . sodium chloride 75 mL/hr at 12/16/14 1706    Active Problems:   Discitis   COPD (chronic obstructive pulmonary disease) ([facility])   HTN (hypertension)   CAD (coronary artery disease)   Pacemaker   Anxiety state   Arthritis   Diabetes ([facility])   Pressure ulcer    Time spent: 25 minutes    Savannha Welle  [facility] Pager [phone]. If 7PM-7AM, please contact night-coverage at [email or web address], password [facility] 12/16/2014, 7:13 PM  LOS: 1 day

## 2014-12-17 ENCOUNTER — Inpatient Hospital Stay (HOSPITAL_COMMUNITY): Payer: Medicare Other

## 2014-12-17 DIAGNOSIS — M8610 Other acute osteomyelitis, unspecified site: Secondary | ICD-10-CM | POA: Insufficient documentation

## 2014-12-17 LAB — BASIC METABOLIC PANEL
ANION GAP: 8 (ref 5–15)
BUN: 31 mg/dL — AB (ref 6–20)
CHLORIDE: 112 mmol/L — AB (ref 101–111)
CO2: 25 mmol/L (ref 22–32)
Calcium: 8.8 mg/dL — ABNORMAL LOW (ref 8.9–10.3)
Creatinine, Ser: 1.08 mg/dL — ABNORMAL HIGH (ref 0.44–1.00)
GFR calc Af Amer: 58 mL/min — ABNORMAL LOW (ref >60)
GFR, EST NON AFRICAN AMERICAN: 50 mL/min — AB (ref >60)
GLUCOSE: 83 mg/dL (ref 65–99)
POTASSIUM: 3.7 mmol/L (ref 3.5–5.1)
Sodium: 145 mmol/L (ref 135–145)

## 2014-12-17 LAB — GLUCOSE, CAPILLARY
GLUCOSE-CAPILLARY: 120 mg/dL — AB (ref 65–99)
GLUCOSE-CAPILLARY: 121 mg/dL — AB (ref 65–99)
GLUCOSE-CAPILLARY: 106 mg/dL — AB (ref 65–99)
Glucose-Capillary: 67 mg/dL (ref 65–99)
Glucose-Capillary: 125 mg/dL — ABNORMAL HIGH (ref 65–99)

## 2014-12-17 LAB — CBC
HEMATOCRIT: 30.3 % — AB (ref 36.0–46.0)
HEMOGLOBIN: 9.2 g/dL — AB (ref 12.0–15.0)
MCH: 25.8 pg — AB (ref 26.0–34.0)
MCHC: 30.4 g/dL (ref 30.0–36.0)
MCV: 84.9 fL (ref 78.0–100.0)
PLATELETS: 117 10*3/uL — AB (ref 150–400)
RBC: 3.57 MIL/uL — AB (ref 3.87–5.11)
RDW: 17.6 % — ABNORMAL HIGH (ref 11.5–15.5)
WBC: 6.9 10*3/uL (ref 4.0–10.5)

## 2014-12-17 MED ORDER — VANCOMYCIN HCL IN DEXTROSE 750-5 MG/150ML-% IV SOLN
750.0000 mg | Freq: Two times a day (BID) | INTRAVENOUS | Status: DC
  Administered 2014-12-17 – 2014-12-19 (×5): 750 mg via INTRAVENOUS
  Filled 2014-12-17 (×6): qty 150

## 2014-12-17 MED ORDER — FENTANYL CITRATE (PF) 100 MCG/2ML IJ SOLN
INTRAMUSCULAR | Status: AC
  Filled 2014-12-17: qty 2

## 2014-12-17 MED ORDER — DEXTROSE-NACL 5-0.45 % IV SOLN
INTRAVENOUS | Status: DC
  Administered 2014-12-17: 09:00:00 via INTRAVENOUS

## 2014-12-17 MED ORDER — DEXTROSE 50 % IV SOLN
INTRAVENOUS | Status: AC
  Administered 2014-12-17: 09:00:00
  Filled 2014-12-17: qty 50

## 2014-12-17 MED ORDER — FENTANYL CITRATE (PF) 100 MCG/2ML IJ SOLN
INTRAMUSCULAR | Status: AC | PRN
  Administered 2014-12-17 (×2): 25 ug via INTRAVENOUS

## 2014-12-17 MED ORDER — MIDAZOLAM HCL 2 MG/2ML IJ SOLN
INTRAMUSCULAR | Status: AC
Start: 2014-12-17 — End: 2014-12-18
  Filled 2014-12-17: qty 2

## 2014-12-17 MED ORDER — MIDAZOLAM HCL 2 MG/2ML IJ SOLN
INTRAMUSCULAR | Status: AC
  Filled 2014-12-17: qty 2

## 2014-12-17 MED ORDER — BUPIVACAINE HCL (PF) 0.25 % IJ SOLN
INTRAMUSCULAR | Status: AC
  Filled 2014-12-17: qty 30

## 2014-12-17 MED ORDER — MIDAZOLAM HCL 2 MG/2ML IJ SOLN
INTRAMUSCULAR | Status: AC | PRN
  Administered 2014-12-17 (×2): 1 mg via INTRAVENOUS

## 2014-12-17 MED ORDER — DEXTROSE 5 % IV SOLN
2.0000 g | INTRAVENOUS | Status: DC
  Administered 2014-12-17 – 2014-12-23 (×7): 2 g via INTRAVENOUS
  Filled 2014-12-17 (×7): qty 2

## 2014-12-17 NOTE — Progress Notes (Signed)
SLP Cancellation Note  Patient Details Name: Kellie Acosta MRN: [id number] DOB: 05-05-1945   Cancelled treatment:       Reason Eval/Treat Not Completed: Medical issues which prohibited therapy. Pt NPO this morning pending surgery. Will f/u as able.   Kellie Acosta, Kellie Acosta [phone]  Kellie Acosta, Kellie Acosta 12/17/2014, 8:39 AM

## 2014-12-17 NOTE — Sedation Documentation (Signed)
Patient is resting comfortably. 

## 2014-12-17 NOTE — Progress Notes (Signed)
ANTIBIOTIC CONSULT NOTE - INITIAL  Pharmacy Consult for vancomycin Indication: Osteomyelitis  Allergies  Allergen Reactions  . Codeine   . Meperidine And Related   . Penicillins Itching  . Pentazocine     Patient Measurements: Weight: 188 lb 7.9 oz (85.5 kg)  Vital Signs: Temp: 98.4 F (36.9 C) (12/08 0904) Temp Source: Oral (12/08 0904) BP: 111/63 mmHg (12/08 1545) Pulse Rate: 92 (12/08 1545) Intake/Output from previous day: 12/07 0701 - 12/08 0700 In: 1975 [P.O.:540; I.V.:1435] Out: 675 [Urine:675] Intake/Output from this shift: Total I/O In: 0  Out: 150 [Urine:150]  Labs:  Recent Labs  12/15/14 1910 12/16/14 0525 12/17/14 0650  WBC 13.5* 12.5* 6.9  HGB 10.5* 7.8* 9.2*  PLT 206 134* 117*  CREATININE 1.34* 1.48* 1.08*   CrCl cannot be calculated (Unknown ideal weight.). No results for input(s): VANCOTROUGH, VANCOPEAK, VANCORANDOM, GENTTROUGH, GENTPEAK, GENTRANDOM, TOBRATROUGH, TOBRAPEAK, TOBRARND, AMIKACINPEAK, AMIKACINTROU, AMIKACIN in the last 72 hours.   Microbiology: No results found for this or any previous visit (from the past 720 hour(s)).  Medical History: Past Medical History  Diagnosis Date  . PAF (paroxysmal atrial fibrillation) (HCC)     pacemaker  . HTN (hypertension)   . Syncope   . Polypharmacy   . Myocardial infarction (HCC)   . Presence of permanent cardiac pacemaker   . Stroke (HCC)   . CHF (congestive heart failure) (HCC)   . Shortness of breath dyspnea   . Diabetes mellitus without complication (HCC)   . Depression   . Dementia   . Anxiety   . GERD (gastroesophageal reflux disease)   . Arthritis   . Fibromyalgia     Assessment: Kellie Acosta admitted with Osteomyelitis/ discitis s/p laminectomy in June. She will get disc aspiration. Pharmacy is consulted to start vancomycin. She is also started on rocephin 2g Q 24. Scr 1.08, but has been 1.3 - 1.5 recently.    Goal of Therapy:  Vancomycin trough level 15-20 mcg/ml  Plan:  -  Vancomycin 750 mg IV Q 12 hrs - Monitor renal function closely - F/u cultures  Bayard HuggerMei Saloni Lablanc, PharmD, BCPS  Clinical Pharmacist  Pager: [phone]   12/17/2014,3:47 PM

## 2014-12-17 NOTE — Progress Notes (Signed)
error 

## 2014-12-17 NOTE — Sedation Documentation (Signed)
Dr. Corliss Skainseveshwar at bedside, time out done and sedation ordered before turning pt prone.

## 2014-12-17 NOTE — Progress Notes (Signed)
[facility] PROGRESS NOTE  Pheobe Sandiford BJY:[id number] DOB: November 17, 1945 DOA: 12/15/2014 PCP: No primary care provider on file. Brief history: Kellie Acosta is a 69 y.o. female has a past medical history significant for hypertension, hyperlipidemia, polypharmacy, prior history of stroke, prior history of paroxysmal A. fib now with a pacemaker, CHF, anxiety, dementia, is being direct admitted from [facility] emergency room with a chief complaint back pain. She underwent a back surgery in [place] by Dr Loralie Champagne in 06/2014 and was transferred to rehab and since then, she had been bed bound, had more falls and has been lethargic from the pain meds. The family reports she has no appetite and doesn't eat and does n't participate in therapy.  She also has been on and off antibiotics since then. She presented to [facility] with severe back and was transferred here for further evaluation.  On arrival to [facility], she was encephalopathic probably from the medications, confused and hypotensive. She responded briefly to narcan, but became very agitated and we had to give her benzodiazepines. CT lumbar spine ordered which showed progression of erosive destructive changes at the L4 L5 level consistent with persistent and worsened discitis osteomyelitis.  the IR and ID consulted for recommendations. She underwent disc aspiration on 12/8 and antibiotics were started after the aspiration by ID.  Speech eval recommended dysphagia 1 diet with thin liquids.     Assessment/Plan:                                 Acute on chronic osteomyelitis: CT lumbar spine, showed progression and worsening of the osteomyelitis at L4 AND l5.  - IR consulted for aspiration. Done today. Antibiotics started by ID.     Acute encephalopathy: Improved during the later day.  Possibly from medications.   Acute renal failure:  IMPROVED with hydration. Monitor renal parameters.   Drop in hemoglobin / anemia of unclear  etiology: - normocytic, get anemia panel, stool for occult blood ordered.   COPD: No wheezing heard.  Resume nebs as needed.   Acute respiratory failure with hypoxia: probably from use of pain meds and sedatives.   Hypokalemia: replete as needed.    Code Status: FULL CODE.  Family Communication: FAMILY AT BEDSIDE.  Disposition Plan: pending further investigation, pendin PT eval. .    Consultants: IR ID Procedures:  Disc aspiration  Antibiotics  Vancomycin  Rocephin.   HPI/Subjective: Sleeping today.  Objective: Filed Vitals:   12/17/14 1551 12/17/14 1604  BP: 117/66 149/58  Pulse: 105 94  Temp:  98.6 F (37 C)  Resp: 11 12    Intake/Output Summary (Last 24 hours) at 12/17/14 1704 Last data filed at 12/17/14 1607  Gross per 24 hour  Intake   1915 ml  Output    900 ml  Net   1015 ml   Filed Weights   12/15/14 2156 12/16/14 2055  Weight: 84.5 kg (186 lb 4.6 oz) 85.5 kg (188 lb 7.9 oz)    Exam:   General:  Sleeping comfortably.  Cardiovascular: s1s2  Respiratory: ctab  Abdomen: soft NT ND bs+  Musculoskeletal: trace pedal edema.   Data Reviewed: Basic Metabolic Panel:  Recent Labs Lab 12/15/14 1910 12/16/14 0525 12/17/14 0650  NA 143 143 145  K 3.6 3.3* 3.7  CL 107 108 112*  CO2 GLUCOSE 114* 91 83  BUN 38* 39* 31*  CREATININE 1.34* 1.48* 1.08*  CALCIUM 8.9 8.5* 8.8*   Liver Function Tests:  Recent Labs Lab 12/15/14 1910  AST 28  ALT 19  ALKPHOS 76  BILITOT 0.5  PROT 5.8*  ALBUMIN 2.5*   No results for input(s): LIPASE, AMYLASE in the last 168 hours. No results for input(s): AMMONIA in the last 168 hours. CBC:  Recent Labs Lab 12/15/14 1910 12/16/14 0525 12/17/14 0650  WBC 13.5* 12.5* 6.9  NEUTROABS 10.6*  --   --   HGB 10.5* 7.8* 9.2*  HCT 35.5* 25.9* 30.3*  MCV 87.9 86.0 84.9  PLT 206 134* 117*   Cardiac Enzymes: No results for input(s): CKTOTAL, CKMB, CKMBINDEX, TROPONINI in the last 168  hours. BNP (last 3 results) No results for input(s): BNP in the last 8760 hours.  ProBNP (last 3 results) No results for input(s): PROBNP in the last 8760 hours.  CBG:  Recent Labs Lab 12/16/14 1647 12/16/14 2059 12/17/14 0744 12/17/14 0946 12/17/14 1145  GLUCAP 73 101* 67 125* 120*    No results found for this or any previous visit (from the past 240 hour(s)).   Studies: Ct Lumbar Spine W Contrast  12/16/2014  CLINICAL DATA:  Personal history of previous laminectomy. Low back pain without described radiation. Previous diagnosis of discitis at L4-5. Unable to have MRI because of pacemaker. EXAM: CT LUMBAR SPINE WITH CONTRAST TECHNIQUE: Multidetector CT imaging of the lumbar spine was performed with intravenous contrast administration. Multiplanar CT image reconstructions were also generated. CONTRAST:  60 cc Isovue-300 COMPARISON:  10/20/2014.  10/07/2014.  08/10/2014. FINDINGS: No changes significant finding at L3-4 or above. At L4-5, there is further worsening of erosive destruction of the vertebral bodies inferiorly at L4 and superiorly at L5 consistent with discitis osteomyelitis. There has been previous left hemilaminectomy. There is some residual bulging disc material. There is foraminal encroachment bilaterally by disc material, destroyed bone and inflammatory material, worse on the left than the right. No significant para spinous or psoas changes. L5-S1: Disc degeneration with endplate osteophytes and bulging of the disc. Facet degeneration. Mild narrowing of the lateral recesses and foramina without definite neural compression. IMPRESSION: Further worsening of erosive destructive changes at the L4-5 level consistent with persistent and worsened discitis osteomyelitis. Lateral recess and foraminal encroachment by disc, destroyed bone and inflammatory tissue. Electronically Signed   By: Paulina FusiMark  Shogry M.D.   On: 12/16/2014 12:32    Scheduled Meds: . antiseptic oral rinse  7 mL Mouth  Rinse BID  . bupivacaine (PF)      . cefTRIAXone (ROCEPHIN)  IV  2 g Intravenous Q24H  . [START ON 12/18/2014] enoxaparin (LOVENOX) injection  40 mg Subcutaneous Q24H  . fentaNYL      . insulin aspart  0-9 Units Subcutaneous TID WC  . midazolam      . sodium chloride  3 mL Intravenous Q12H  . vancomycin  750 mg Intravenous Q12H   Continuous Infusions: . dextrose 5 % and 0.45% NaCl 50 mL/hr at 12/17/14 [id number]    Active Problems:   Discitis   COPD (chronic obstructive pulmonary disease) (HCC)   HTN (hypertension)   CAD (coronary artery disease)   Pacemaker   Anxiety state   Arthritis   Diabetes (HCC)   Pressure ulcer    Time spent: 25 minutes    Odus Clasby  [facility] Pager [phone]. If 7PM-7AM, please contact night-coverage at [email or web address], password [email or web address] 12/17/2014, 5:04 PM  LOS: 2 days

## 2014-12-17 NOTE — Procedures (Signed)
S/P L4-L5 fluoroguided disc aspiration x 3 passes

## 2014-12-17 NOTE — Clinical Documentation Improvement (Signed)
Internal Medicine  A cause and effect relationship may not be assumed and must be documented by a provider.  Please clarify the relationship, if any, between worsening osteomyelitis/discitis and previous laminectomy in progress notes and discharge summary.  Are the conditions:   Due to or associated with each other  Unrelated to each other  Other  Clinically Undetermined   Supporting Information (risk factors, sign and symptoms, diagnostics, treatment):  Per H&P: Patient apparently had laminectomy done by Dr. Loralie Champagneurrani in [place] in June 2016 and since then she has been having a lot of issues with her back pain, lower extremity weakness and Discitis / Osteomyelitis - Patient will need a CT scan of the L-spine area, obtain renal function first see she hasn't had renal function done today, will sign out to the night team to review labs - We'll likely need an ID consult in the morning as well as IR to sample of tissue - She is not febrile, will hold antibiotics for now to maximize the yield if she is to undergo an aspirate  - obtain sed rate and CRPinability to walk without excruciating pain.    Please exercise your independent, professional judgment when responding. A specific answer is not anticipated or expected.   Thank You,  Harless Littenebora T Porcha Deblanc [employer] Information [facility] [place] [phone]

## 2014-12-17 NOTE — Progress Notes (Addendum)
INFECTIOUS DISEASE PROGRESS NOTE  ID: Kellie Acosta is a 69 y.o. female with  Active Problems:   Discitis   COPD (chronic obstructive pulmonary disease) (HCC)   HTN (hypertension)   CAD (coronary artery disease)   Pacemaker   Anxiety state   Arthritis   Diabetes (HCC)   Pressure ulcer  Subjective: Resting quietly post-procedure  Abtx:  Anti-infectives    None      Medications:  Scheduled: . antiseptic oral rinse  7 mL Mouth Rinse BID  . bupivacaine (PF)      . [START ON 12/18/2014] enoxaparin (LOVENOX) injection  40 mg Subcutaneous Q24H  . fentaNYL      . insulin aspart  0-9 Units Subcutaneous TID WC  . midazolam      . sodium chloride  3 mL Intravenous Q12H    Objective: Vital signs in last 24 hours: Temp:  [97.4 F (36.3 C)-98.4 F (36.9 C)] 98.4 F (36.9 C) (12/08 0904) Pulse Rate:  [87-101] 98 (12/08 1534) Resp:  [8-18] 10 (12/08 1534) BP: (114-156)/(52-84) 150/70 mmHg (12/08 1534) SpO2:  [94 %-100 %] 98 % (12/08 1534) Weight:  [85.5 kg (188 lb 7.9 oz)] 85.5 kg (188 lb 7.9 oz) (12/07 2055)   General appearance: fatigued and no distress Resp: clear to auscultation bilaterally Cardio: regular rate and rhythm GI: normal findings: bowel sounds normal and soft, non-tender  Lab Results  Recent Labs  12/16/14 0525 12/17/14 0650  WBC 12.5* 6.9  HGB 7.8* 9.2*  HCT 25.9* 30.3*  NA 143 145  K 3.3* 3.7  CL 108 112*  CO2 28 25  BUN 39* 31*  CREATININE 1.48* 1.08*   Liver Panel  Recent Labs  12/15/14 1910  PROT 5.8*  ALBUMIN 2.5*  AST 28  ALT 19  ALKPHOS 76  BILITOT 0.5   Sedimentation Rate  Recent Labs  12/15/14 1910  ESRSEDRATE 65*   C-Reactive Protein  Recent Labs  12/15/14 1910  CRP 1.6*    Microbiology: No results found for this or any previous visit (from the past 240 hour(s)).  Studies/Results: Ct Lumbar Spine W Contrast  12/16/2014  CLINICAL DATA:  Personal history of previous laminectomy. Low back pain without  described radiation. Previous diagnosis of discitis at L4-5. Unable to have MRI because of pacemaker. EXAM: CT LUMBAR SPINE WITH CONTRAST TECHNIQUE: Multidetector CT imaging of the lumbar spine was performed with intravenous contrast administration. Multiplanar CT image reconstructions were also generated. CONTRAST:  60 cc Isovue-300 COMPARISON:  10/20/2014.  10/07/2014.  08/10/2014. FINDINGS: No changes significant finding at L3-4 or above. At L4-5, there is further worsening of erosive destruction of the vertebral bodies inferiorly at L4 and superiorly at L5 consistent with discitis osteomyelitis. There has been previous left hemilaminectomy. There is some residual bulging disc material. There is foraminal encroachment bilaterally by disc material, destroyed bone and inflammatory material, worse on the left than the right. No significant para spinous or psoas changes. L5-S1: Disc degeneration with endplate osteophytes and bulging of the disc. Facet degeneration. Mild narrowing of the lateral recesses and foramina without definite neural compression. IMPRESSION: Further worsening of erosive destructive changes at the L4-5 level consistent with persistent and worsened discitis osteomyelitis. Lateral recess and foraminal encroachment by disc, destroyed bone and inflammatory tissue. Electronically Signed   By: Paulina FusiMark  Shogry M.D.   On: 12/16/2014 12:32     Assessment/Plan: Osteomyelitis, Discitis Await aspirate Cx and g/s Start ceftriaxone and vanco  Mental Status change Not clear if from  medications or infection  ARF Cr now normal.   Total days of antibiotics: 0         Johny Sax Infectious Diseases (pager) [phone] [email or web address] 12/17/2014, 3:37 PM  LOS: 2 days

## 2014-12-17 NOTE — H&P (Signed)
Chief Complaint: Back pain  Referring Physician(s): Akula  History of Present Illness: Kellie Acosta is a 69 y.o. female with a past medical history significant for hypertension, hyperlipidemia, polypharmacy, prior history of stroke, prior history of paroxysmal A. fib now with a pacemaker, CHF, anxiety, dementia,  She was transferred here from [facility] emergency room with a chief complaint back pain.   Per her chart, she had laminectomy done by Dr. Loralie Champagne in [place] in June 2016 and since then she has been having a lot of issues with her back pain, lower extremity weakness and inability to walk without excruciating pain.   Since admission [name] has been quite lethargic, she wakes up and is able to answer basic questions however falls back asleep right away.   She only complains of back pain. Per review of the chart, she's been having a lot of back issues and she's been on and off on antibiotics since her surgery in June.   Most recently the patient was started on Zyvox for her PCP about a week ago.   X-ray of her spine in the ED at [facility] which showed progression of osteomyelitis, and patient was transferred here for further workup.   CT scan here reveals further worsening of erosive destruction of the vertebral bodies inferiorly at L4 and superiorly at L5 consistent with discitis osteomyelitis.  We are asked to evaluate her for image guided disc aspiration.   Past Medical History  Diagnosis Date  . PAF (paroxysmal atrial fibrillation) (HCC)     pacemaker  . HTN (hypertension)   . Syncope   . Polypharmacy   . Myocardial infarction (HCC)   . Presence of permanent cardiac pacemaker   . Stroke (HCC)   . CHF (congestive heart failure) (HCC)   . Shortness of breath dyspnea   . Diabetes mellitus without complication (HCC)   . Depression   . Dementia   . Anxiety   . GERD (gastroesophageal reflux disease)   . Arthritis   . Fibromyalgia     Past Surgical  History  Procedure Laterality Date  . Insert / replace / remove pacemaker    . Back surgery  06/2014,07/2014    LUMBAR LAMINECTOMY L4-L5  . Appendectomy    . Abdominal hysterectomy      Allergies: Codeine; Meperidine and related; Penicillins; and Pentazocine  Medications: Prior to Admission medications   Medication Sig Start Date End Date Taking? Authorizing Provider  aspirin EC 81 MG tablet Take 81 mg by mouth daily.   Yes Historical Provider, MD  furosemide (LASIX) 40 MG tablet Take 40 mg by mouth daily.   Yes Historical Provider, MD  Lactobacillus (FLORAJEN ACIDOPHILUS) CAPS Take 1 capsule by mouth daily.   Yes Historical Provider, MD  Melatonin 3 MG TABS Take 6 mg by mouth at bedtime.   Yes Historical Provider, MD  methadone (DOLOPHINE) 5 MG tablet Take 5 mg by mouth 3 (three) times daily.   Yes Historical Provider, MD  pregabalin (LYRICA) 150 MG capsule Take 150 mg by mouth 2 (two) times daily.   Yes Historical Provider, MD  silver sulfADIAZINE (SILVADENE) 1 % cream Apply 1 application topically every evening. Outer left heel   Yes Historical Provider, MD  tolterodine (DETROL) 2 MG tablet Take 4 mg by mouth daily.   Yes Historical Provider, MD  traZODone (DESYREL) 100 MG tablet Take 100 mg by mouth at bedtime.   Yes Historical Provider, MD  ALPRAZolam Prudy Feeler) 1 MG tablet Take 0.5  mg by mouth every 8 (eight) hours as needed for anxiety.     Historical Provider, MD  traMADol (ULTRAM) 50 MG tablet Take 50 mg by mouth every 6 (six) hours as needed.      Historical Provider, MD     Family History  Problem Relation Age of Onset  . Heart disease    . Cancer    . Heart disease Mother   . Heart disease Father   . Heart disease Sister   . Heart disease Brother     Social History   Social History  . Marital Status: Divorced    Spouse Name: N/A  . Number of Children: 3  . Years of Education: N/A   Occupational History  . retired    Social History Main Topics  . Smoking  status: Never Smoker   . Smokeless tobacco: None  . Alcohol Use: No  . Drug Use: No  . Sexual Activity: No   Other Topics Concern  . None   Social History Narrative   PPM   Certified letter 1/12 djw     ECOG Status: 3 - Symptomatic, >50% confined to bed  Review of Systems: A 12 point ROS discussed and pertinent positives are indicated in the HPI above.  All other systems are negative.  Review of Systems  Constitutional: Positive for activity change and fatigue. Negative for fever, chills and appetite change.  HENT: Negative.   Respiratory: Negative for cough, chest tightness and shortness of breath.   Cardiovascular: Negative for chest pain.  Gastrointestinal: Negative for nausea, vomiting and abdominal pain.  Genitourinary: Negative.   Musculoskeletal: Positive for back pain and gait problem.  Skin: Negative.   Neurological: Negative.   Psychiatric/Behavioral: Negative.     Vital Signs: BP 116/57 mmHg  Pulse 90  Temp(Src) 97.4 F (36.3 C) (Oral)  Resp 14  Wt 188 lb 7.9 oz (85.5 kg)  SpO2 100%  LMP  (LMP Unknown)  Physical Exam  Constitutional: She appears well-developed and well-nourished.  HENT:  Head: Normocephalic and atraumatic.  Eyes: EOM are normal.  Neck: Normal range of motion. Neck supple.  Pulmonary/Chest: Effort normal and breath sounds normal.  Abdominal: Soft. Bowel sounds are normal. She exhibits no distension. There is no tenderness.  Musculoskeletal:  She refuses to turn over to allow me to palpate her spine due to pain.  Neurological:  Very sleepy but does open her eyes to voice and follows commands.  Skin: Skin is warm and dry.  Psychiatric: She has a normal mood and affect. Her behavior is normal. Judgment and thought content normal.  Vitals reviewed.   Mallampati Score:  MD Evaluation Airway: WNL Heart: WNL Abdomen: WNL Chest/ Lungs: WNL Chest/ lungs comments: On O2 via [place] ASA  Classification: 3 Mallampati/Airway Score:  Two  Imaging: Ct Lumbar Spine W Contrast  12/16/2014  CLINICAL DATA:  Personal history of previous laminectomy. Low back pain without described radiation. Previous diagnosis of discitis at L4-5. Unable to have MRI because of pacemaker. EXAM: CT LUMBAR SPINE WITH CONTRAST TECHNIQUE: Multidetector CT imaging of the lumbar spine was performed with intravenous contrast administration. Multiplanar CT image reconstructions were also generated. CONTRAST:  60 cc Isovue-300 COMPARISON:  10/20/2014.  10/07/2014.  08/10/2014. FINDINGS: No changes significant finding at L3-4 or above. At L4-5, there is further worsening of erosive destruction of the vertebral bodies inferiorly at L4 and superiorly at L5 consistent with discitis osteomyelitis. There has been previous left hemilaminectomy. There is some residual bulging  disc material. There is foraminal encroachment bilaterally by disc material, destroyed bone and inflammatory material, worse on the left than the right. No significant para spinous or psoas changes. L5-S1: Disc degeneration with endplate osteophytes and bulging of the disc. Facet degeneration. Mild narrowing of the lateral recesses and foramina without definite neural compression. IMPRESSION: Further worsening of erosive destructive changes at the L4-5 level consistent with persistent and worsened discitis osteomyelitis. Lateral recess and foraminal encroachment by disc, destroyed bone and inflammatory tissue. Electronically Signed   By: Paulina FusiMark  Shogry M.D.   On: 12/16/2014 12:32    Labs:  CBC:  Recent Labs  12/15/14 1910 12/16/14 0525 12/17/14 0650  WBC 13.5* 12.5* 6.9  HGB 10.5* 7.8* 9.2*  HCT 35.5* 25.9* 30.3*  PLT 206 134* PENDING    COAGS:  Recent Labs  12/15/14 1910  INR 1.22    BMP:  Recent Labs  12/15/14 1910 12/16/14 0525 12/17/14 0650  NA 143 143 145  K 3.6 3.3* 3.7  CL 107 108 112*  CO2 25 28 25   GLUCOSE 114* 91 83  BUN 38* 39* 31*  CALCIUM 8.9 8.5* 8.8*   CREATININE 1.34* 1.48* 1.08*  GFRNONAA 39* 34* 50*  GFRAA 45* 40* 58*    LIVER FUNCTION TESTS:  Recent Labs  12/15/14 1910  BILITOT 0.5  AST 28  ALT 19  ALKPHOS 76  PROT 5.8*  ALBUMIN 2.5*    TUMOR MARKERS: No results for input(s): AFPTM, CEA, CA199, CHROMGRNA in the last 8760 hours.  Assessment and Plan:  L4-5 discitis/osteomyelitis  Imaging reviewed by Dr. Corliss Skainseveshwar  Will proceed with image guided disc aspiration today by Dr. Corliss Skainseveshwar.  Risks and Benefits discussed with the patient including, but not limited to bleeding, infection, damage to adjacent structures or low yield requiring additional tests.  All of the patient's questions were answered, patient is agreeable to proceed. Consent obtained via telephone from spouse, signed by myself and patients nurse and in chart.  Thank you for this interesting consult.  I greatly enjoyed meeting Overton MamDoris Cuellar and look forward to participating in their care.  A copy of this report was sent to the requesting provider on this date.  Signed: Gwynneth MacleodWENDY S BLAIR PA-C 12/17/2014, 8:27 AM   I spent a total of 40 Minutes in face to face in clinical consultation, greater than 50% of which was counseling/coordinating care for image guided disc aspiration.

## 2014-12-17 NOTE — Progress Notes (Signed)
Utilization review completed. Nolan Tuazon, RN, BSN. 

## 2014-12-18 LAB — CBC
HEMATOCRIT: 26 % — AB (ref 36.0–46.0)
Hemoglobin: 8.3 g/dL — ABNORMAL LOW (ref 12.0–15.0)
MCH: 26.4 pg (ref 26.0–34.0)
MCHC: 31.9 g/dL (ref 30.0–36.0)
MCV: 82.8 fL (ref 78.0–100.0)
Platelets: 106 10*3/uL — ABNORMAL LOW (ref 150–400)
RBC: 3.14 MIL/uL — ABNORMAL LOW (ref 3.87–5.11)
RDW: 17.4 % — AB (ref 11.5–15.5)
WBC: 6.6 10*3/uL (ref 4.0–10.5)

## 2014-12-18 LAB — BASIC METABOLIC PANEL
Anion gap: 5 (ref 5–15)
BUN: 20 mg/dL (ref 6–20)
CALCIUM: 8.8 mg/dL — AB (ref 8.9–10.3)
CO2: 26 mmol/L (ref 22–32)
CREATININE: 0.67 mg/dL (ref 0.44–1.00)
Chloride: 114 mmol/L — ABNORMAL HIGH (ref 101–111)
GFR calc non Af Amer: >60 mL/min (ref >60)
GLUCOSE: 120 mg/dL — AB (ref 65–99)
Potassium: 3 mmol/L — ABNORMAL LOW (ref 3.5–5.1)
Sodium: 145 mmol/L (ref 135–145)

## 2014-12-18 LAB — MAGNESIUM: Magnesium: 1.5 mg/dL — ABNORMAL LOW (ref 1.7–2.4)

## 2014-12-18 LAB — GLUCOSE, CAPILLARY
Glucose-Capillary: 129 mg/dL — ABNORMAL HIGH (ref 65–99)
Glucose-Capillary: 136 mg/dL — ABNORMAL HIGH (ref 65–99)
Glucose-Capillary: 122 mg/dL — ABNORMAL HIGH (ref 65–99)
Glucose-Capillary: 126 mg/dL — ABNORMAL HIGH (ref 65–99)

## 2014-12-18 MED ORDER — TRAMADOL HCL 50 MG PO TABS
50.0000 mg | ORAL_TABLET | Freq: Four times a day (QID) | ORAL | Status: DC | PRN
  Administered 2014-12-19 – 2014-12-20 (×2): 50 mg via ORAL
  Filled 2014-12-18 (×2): qty 1

## 2014-12-18 MED ORDER — MAGNESIUM SULFATE 2 GM/50ML IV SOLN
2.0000 g | Freq: Once | INTRAVENOUS | Status: AC
  Administered 2014-12-18: 2 g via INTRAVENOUS
  Filled 2014-12-18: qty 50

## 2014-12-18 MED ORDER — POLYETHYLENE GLYCOL 3350 17 G PO PACK
17.0000 g | PACK | Freq: Every day | ORAL | Status: DC
  Administered 2014-12-19 – 2014-12-24 (×6): 17 g via ORAL
  Filled 2014-12-18 (×6): qty 1

## 2014-12-18 MED ORDER — ENSURE ENLIVE PO LIQD
237.0000 mL | Freq: Three times a day (TID) | ORAL | Status: DC
  Administered 2014-12-20 – 2014-12-24 (×6): 237 mL via ORAL

## 2014-12-18 MED ORDER — METHADONE HCL 5 MG PO TABS: 5.0000 mg | ORAL_TABLET | Freq: Three times a day (TID) | ORAL | Status: DC

## 2014-12-18 MED ORDER — METHADONE HCL 5 MG PO TABS
5.0000 mg | ORAL_TABLET | Freq: Two times a day (BID) | ORAL | Status: DC
  Administered 2014-12-18 – 2014-12-24 (×12): 5 mg via ORAL
  Filled 2014-12-18 (×12): qty 1

## 2014-12-18 MED ORDER — POTASSIUM CHLORIDE CRYS ER 20 MEQ PO TBCR
40.0000 meq | EXTENDED_RELEASE_TABLET | Freq: Two times a day (BID) | ORAL | Status: AC
  Administered 2014-12-18: 40 meq via ORAL
  Filled 2014-12-18: qty 2

## 2014-12-18 MED ORDER — SENNOSIDES-DOCUSATE SODIUM 8.6-50 MG PO TABS
2.0000 | ORAL_TABLET | Freq: Two times a day (BID) | ORAL | Status: DC
  Administered 2014-12-19 – 2014-12-24 (×11): 2 via ORAL
  Filled 2014-12-18 (×11): qty 2

## 2014-12-18 NOTE — Care Management Important Message (Signed)
Important Message  Patient Details  Name: Overton MamDoris Picotte MRN: [id number] Date of Birth: December 20, 1945   Medicare Important Message Given:  Yes    Mardene SayerGreenlee, Delanna Blacketer 12/18/2014, 4:49 PM

## 2014-12-18 NOTE — Evaluation (Signed)
Physical Therapy Evaluation Patient Details Name: Kellie Acosta MRN: [id number] DOB: May 03, 1945 Today'Acosta Date: 12/18/2014   History of Present Illness  Kellie Acosta is a 69 y.o. female has a past medical history significant for hypertension, hyperlipidemia, polypharmacy, prior history of stroke, prior history of paroxysmal A. fib now with a pacemaker, CHF, anxiety, and dementia. Acosta/p IR guided lumbar disc aspiration on 12/8 for Acute on chronic osteomyelitis of L4 and L5.  Clinical Impression  Pt admitted with the above complications, seen following the above procedure. Pt currently with functional limitations due to the deficits listed below (see PT Problem List). Very limited assessment due to pain and agitation. Required significant physical assist just to achieve EOB sitting. Once upright, pt required intermittent assist for truncal support but progressed to single UE use for stability. Pt will benefit from skilled PT to increase their independence and safety with mobility to allow discharge to the venue listed below.       Follow Up Recommendations SNF;Supervision/Assistance - 24 hour    Equipment Recommendations  None recommended by PT    Recommendations for Other Services       Precautions / Restrictions Precautions Precautions: Back (for comfort) Precaution Comments: Back precautions for comfort. Not specifically ordered Restrictions Weight Bearing Restrictions: No      Mobility  Bed Mobility Overal bed mobility: Needs Assistance Bed Mobility: Rolling;Sidelying to Sit;Sit to Sidelying Rolling: Mod assist;+2 for physical assistance Sidelying to sit: Total assist;+2 for physical assistance     Sit to sidelying: Min assist;+2 for physical assistance General bed mobility comments: Performs better rolling towards right side. Hand over hand guidance to reach for rails. Max VC for technique and physical assist to roll to Lt and right. Total assist to rise to EOB, Pt screams but  stops when distracted. Able to bring self to sidelying position but needs Assist for LEs back into bed.  Transfers                    Ambulation/Gait                Stairs            Wheelchair Mobility    Modified Rankin (Stroke Patients Only)       Balance Overall balance assessment: Needs assistance;History of Falls Sitting-balance support: Single extremity supported;Feet supported Sitting balance-Leahy Scale: Poor Sitting balance - Comments: Sat EOB x5 minutes Progressing to single UE support and intermittent min assist for trunk control. Pt able to scoot posteriorly but minimally. Leans posterior and Lt. Postural control: Posterior lean;Left lateral lean                                   Pertinent Vitals/Pain Pain Assessment: Faces Faces Pain Scale: Hurts worst Pain Location: back and "sides"  Pain Descriptors / Indicators: Crying;Grimacing;Guarding;Moaning Pain Intervention(Acosta): Monitored during session;Repositioned;Limited activity within patient'Acosta tolerance    Home Living Family/patient expects to be discharged to:: Skilled nursing facility     Type of Home: Skilled Nursing Facility           Additional Comments: Patient unable to verbalize prior level of function or previous history. She does state that she has not ambulated in months which appears to be accurate from previous notes.     Prior Function Level of Independence: Needs assistance   Gait / Transfers Assistance Needed: Apparently pt has been bed bound at SNF for  several months.     Comments: Patient unable to verbalize prior level of function or previous history. She does state that she has not ambulated in months which appears to be accurate from previous notes.      Hand Dominance        Extremity/Trunk Assessment   Upper Extremity Assessment: Defer to OT evaluation           Lower Extremity Assessment: Difficult to assess due to impaired  cognition         Communication   Communication: No difficulties  Cognition Arousal/Alertness: Lethargic Behavior During Therapy: Agitated;Anxious Overall Cognitive Status: No family/caregiver present to determine baseline cognitive functioning Area of Impairment: Orientation Orientation Level: Disoriented to;Place;Time;Situation             General Comments: Keeps yelling "I can't stand up" after repeatedly telling pt we are only sitting on EOB    General Comments General comments (skin integrity, edema, etc.): Pt became anxious and agitated upon sitting EOB. Calms when distracted. Unable to tolerate activites beyond sitting balance and bed mobility at this time.    Exercises        Assessment/Plan    PT Assessment Patient needs continued PT services  PT Diagnosis Difficulty walking;Acute pain;Generalized weakness   PT Problem List Decreased strength;Decreased range of motion;Decreased activity tolerance;Decreased balance;Decreased mobility;Decreased cognition;Decreased knowledge of use of DME;Decreased safety awareness;Obesity;Pain  PT Treatment Interventions DME instruction;Functional mobility training;Therapeutic activities;Therapeutic exercise;Balance training;Neuromuscular re-education;Cognitive remediation;Patient/family education;Modalities   PT Goals (Current goals can be found in the Care Plan section) Acute Rehab PT Goals Patient Stated Goal: None stated PT Goal Formulation: Patient unable to participate in goal setting Time For Goal Achievement: 01/01/15 Potential to Achieve Goals: Fair    Frequency Min 2X/week   Barriers to discharge        Co-evaluation               End of Session Equipment Utilized During Treatment: Oxygen Activity Tolerance: Patient limited by pain;Treatment limited secondary to agitation Patient left: in bed;with call bell/phone within reach;with bed alarm set Nurse Communication: Mobility status;Need for lift equipment          Time: 1352-1414 PT Time Calculation (min) (ACUTE ONLY): 22 min   Charges:   PT Evaluation $Initial PT Evaluation Tier I: 1 Procedure     PT G CodesBerton Mount:        Kellie Acosta 12/18/2014, 3:52 PM Kellie Acosta, [place] [phone]

## 2014-12-18 NOTE — Progress Notes (Signed)
[facility] PROGRESS NOTE  Kellie MamDoris Lafoe WUJ:[id number] DOB: Mar 11, 1945 DOA: 12/15/2014 PCP: No primary care provider on file. Brief history: Kellie MamDoris Tugman is a 69 y.o. female has a past medical history significant for hypertension, hyperlipidemia, polypharmacy, prior history of stroke, prior history of paroxysmal A. fib now with a pacemaker, CHF, anxiety, dementia, is being direct admitted [facility] room with a chief complaint back pain. She underwent a back surgery in [place] by Dr Loralie Champagneurrani in 06/2014 and was transferred to rehab and since then, she had been bed bound, had more falls and has been lethargic from the pain meds. The family reports she has no appetite and doesn't eat and does n't participate in therapy.  She also has been on and off antibiotics since then. [facility] with severe back and was transferred here for further evaluation.  [facility], she was encephalopathic probably from the medications, confused and hypotensive. She responded briefly to narcan, but became very agitated and we had to give her benzodiazepines. CT lumbar spine ordered which showed progression of erosive destructive changes at the L4 L5 level consistent with persistent and worsened discitis osteomyelitis.  the IR and ID consulted for recommendations. She underwent  IR guided lumbar disc aspiration on 12/8 and antibiotics were started after the aspiration by ID.  Speech eval recommended dysphagia 1 diet with thin liquids.     Assessment/Plan:                                 Acute on chronic osteomyelitis: CT lumbar spine, showed progression and worsening of the osteomyelitis at L4 AND l5.  - IR consulted for  Lumbar disc aspiration, which was done on 12/8 and  Antibiotics started by ID after the aspiration.  - pain control and start physical therapy.  - awaiting the culture report from the disc aspiration, determine the duration of the antibiotics.      Acute encephalopathy: delirium from medication withdrawal.  Restart methadone at a lower dose and continue with xanax.  Prn ativan for agitation.   Acute renal failure:  IMPROVED with hydration. Monitor renal parameters.   Drop in hemoglobin / anemia of unclear etiology: - normocytic, get anemia panel, stool for occult blood ordered.   COPD: No wheezing heard.  Resume nebs as needed.   Acute respiratory failure with hypoxia: probably from use of pain meds and sedatives.   Hypokalemia: replete as needed.  Get magnesium levels today.     Code Status: FULL CODE.  Family Communication: FAMILY AT BEDSIDE.  Disposition Plan: pending further investigation, pendin PT eval. .    Consultants: IR ID Procedures:  IR lumbar Disc aspiration on 12/8  Antibiotics  Vancomycin12/8   Rocephin.   HPI/Subjective: More confused today, and agitated and asking for pain meds.  Objective: Filed Vitals:   12/18/14 0442 12/18/14 0952  BP: 156/71 158/72  Pulse: 96 101  Temp: 98.5 F (36.9 C) 98.6 F (37 C)  Resp: 15 18    Intake/Output Summary (Last 24 hours) at 12/18/14 1320 Last data filed at 12/18/14 0953  Gross per 24 hour  Intake   1390 ml  Output   1075 ml  Net    315 ml   Filed Weights   12/15/14 2156 12/16/14 2055 12/17/14 2026  Weight: 84.5 kg (186 lb 4.6 oz) 85.5 kg (188 lb 7.9 oz) 85.3 kg (188 lb 0.8 oz)  Exam:   General:  Alert and confused.   Cardiovascular: s1s2  Respiratory: ctab  Abdomen: soft NT ND bs+  Musculoskeletal: trace pedal edema.   Data Reviewed: Basic Metabolic Panel:  Recent Labs Lab 12/15/14 1910 12/16/14 0525 12/17/14 0650 12/18/14 0315  NA 143 143 145 145  K 3.6 3.3* 3.7 3.0*  CL 107 108 112* 114*  CO2 GLUCOSE 114* 91 83 120*  BUN 38* 39* 31* 20  CREATININE 1.34* 1.48* 1.08* 0.67  CALCIUM 8.9 8.5* 8.8* 8.8*   Liver Function Tests:  Recent Labs Lab 12/15/14 1910  AST 28  ALT 19  ALKPHOS 76   BILITOT 0.5  PROT 5.8*  ALBUMIN 2.5*   No results for input(s): LIPASE, AMYLASE in the last 168 hours. No results for input(s): AMMONIA in the last 168 hours. CBC:  Recent Labs Lab 12/15/14 1910 12/16/14 0525 12/17/14 0650 12/18/14 0315  WBC 13.5* 12.5* 6.9 6.6  NEUTROABS 10.6*  --   --   --   HGB 10.5* 7.8* 9.2* 8.3*  HCT 35.5* 25.9* 30.3* 26.0*  MCV 87.9 86.0 84.9 82.8  PLT 206 134* 117* 106*   Cardiac Enzymes: No results for input(s): CKTOTAL, CKMB, CKMBINDEX, TROPONINI in the last 168 hours. BNP (last 3 results) No results for input(s): BNP in the last 8760 hours.  ProBNP (last 3 results) No results for input(s): PROBNP in the last 8760 hours.  CBG:  Recent Labs Lab 12/17/14 1145 12/17/14 1703 12/17/14 2015 12/18/14 0807 12/18/14 1211  GLUCAP 120* 121* 106* 129* 136*    Recent Results (from the past 240 hour(s))  Fungus Culture with Smear     Status: None (Preliminary result)   Collection Time: 12/17/14  3:52 PM  Result Value Ref Range Status   Specimen Description ABSCESS  Final   Special Requests L4 L5 DISC ASPIRATION  Final   Fungal Smear   Final    NO YEAST OR FUNGAL ELEMENTS SEEN Performed at [facility]    Culture   Final    CULTURE IN PROGRESS FOR FOUR WEEKS Performed at [facility]    Report Status PENDING  Incomplete  Culture, routine-abscess     Status: None (Preliminary result)   Collection Time: 12/17/14  3:52 PM  Result Value Ref Range Status   Specimen Description ABSCESS  Final   Special Requests L4 L5 DISC ASPIRATION  Final   Gram Stain   Final    NO WBC SEEN NO SQUAMOUS EPITHELIAL CELLS SEEN NO ORGANISMS SEEN Performed at [facility]    Culture PENDING  Incomplete   Report Status PENDING  Incomplete     Studies: No results found.  Scheduled Meds: . antiseptic oral rinse  7 mL Mouth Rinse BID  . cefTRIAXone (ROCEPHIN)  IV  2 g Intravenous Q24H  . enoxaparin (LOVENOX) injection  40 mg  Subcutaneous Q24H  . insulin aspart  0-9 Units Subcutaneous TID WC  . methadone  5 mg Oral TID  . potassium chloride  40 mEq Oral BID  . sodium chloride  3 mL Intravenous Q12H  . vancomycin  750 mg Intravenous Q12H   Continuous Infusions: . dextrose 5 % and 0.45% NaCl 50 mL/hr at 12/17/14 5409    Active Problems:   Discitis   COPD (chronic obstructive pulmonary disease) (HCC)   HTN (hypertension)   CAD (coronary artery disease)   Pacemaker   Anxiety state   Arthritis   Diabetes (HCC)  Pressure ulcer   Acute osteomyelitis (HCC)    Time spent: 25 minutes    [facility]  [facility] Pager [phone]. If 7PM-7AM, please contact night-coverage at [email or web address], password [facility] 12/18/2014, 1:20 PM  LOS: 3 days

## 2014-12-18 NOTE — Progress Notes (Signed)
Speech Language Pathology Treatment: Dysphagia  Patient Details Name: Kellie Acosta MRN: [id number] DOB: Dec 02, 1945 Today's Date: 12/18/2014 Time: [id number] SLP Time Calculation (min) (ACUTE ONLY): 10 min  Assessment / Plan / Recommendation Clinical Impression  Pt continues to have alerted mentation, moaning, requesting to take mitts off her "feet". Consecutive straw sips of orange juice consumed requiring straw removal after 3 sips without difficulty as well as puree (agreeable to only 3 bites). No change in vitals. Continue Dys 1, thin liquids and likely able to upgrade solids once delirium subsides.    HPI HPI: Kellie Acosta is a 69 y.o. female admitted for back pain s/p laminectomy  in June 2016. X-ray shows progression of osteomyelitis. PMH: hypertension, hyperlipidemia, polypharmacy, prior history of stroke, prior history of paroxysmal A. fib now with a pacemaker, CHF, anxiety, dementia.      SLP Plan  Continue with current plan of care     Recommendations  Diet recommendations: Dysphagia 1 (puree);Thin liquid Liquids provided via: Cup;Straw Medication Administration: Crushed with puree Supervision: Full supervision/cueing for compensatory strategies;Staff to assist with self feeding Compensations: Minimize environmental distractions;Slow rate;Small sips/bites Postural Changes and/or Swallow Maneuvers: Seated upright 90 degrees              Oral Care Recommendations: Oral care BID Follow up Recommendations: Skilled Nursing facility Plan: Continue with current plan of care   Royce MacadamiaLitaker, Emilo Gras Willis 12/18/2014, 10:24 AM  Breck CoonsLisa Willis Lonell FaceLitaker M.Ed [employer] Pager [phone]

## 2014-12-18 NOTE — Progress Notes (Signed)
Nutrition Follow-up  DOCUMENTATION CODES:   Obesity unspecified  INTERVENTION:  Provide Ensure Enlive po TID, each supplement provides 350 kcal and 20 grams of protein.  Encourage PO intake.   NUTRITION DIAGNOSIS:   Increased nutrient needs related to wound healing as evidenced by estimated needs; ongoing  GOAL:   Patient will meet greater than or equal to 90% of their needs; not met  MONITOR:   Diet advancement, Skin, Weight trends, Labs, I & O's  REASON FOR ASSESSMENT:   Malnutrition Screening Tool    ASSESSMENT:   69 y.o. female has a past medical history significant for hypertension, hyperlipidemia, polypharmacy, prior history of stroke, prior history of paroxysmal A. fib now with a pacemaker, CHF, anxiety, dementia, is being direct admitted from [facility] emergency room with a chief complaint back pain.  IR lumbar disc aspiration 12/8.  Diet has been advanced to a dysphagia 1 diet with thin liquids. Meal completion has been 0% per record. Pt was asleep during time of visit and would not wake. No family at bedside. RD to order nutritional supplements to aid in caloric and protein needs as well as in wound healing.   Labs and medications reviewed.   Diet Order:  DIET - DYS 1 Room service appropriate?: Yes; Fluid consistency:: Thin  Skin:  Wound (see comment) (Unstg ulcer on L heel, incision on back)  Last BM:  12/7  Height:   Ht Readings from Last 1 Encounters:  12/18/14 5' 6"  (1.676 m)    Weight:   Wt Readings from Last 1 Encounters:  12/17/14 188 lb 0.8 oz (85.3 kg)    Ideal Body Weight:  59 kg  BMI:  Body mass index is 30.37 kg/(m^2).  Estimated Nutritional Needs:   Kcal:  1850-2050  Protein:  85-105 grams  Fluid:  1.8 - 2 L/day  EDUCATION NEEDS:   Education needs no appropriate at this time  Corrin Parker, MS, RD, LDN Pager # [phone] After hours/ weekend pager # [phone]

## 2014-12-18 NOTE — Consult Note (Signed)
WOC wound consult note Reason for Consult: pressure ulcer Wound type: Unstageable pressure injury left heel Pressure Ulcer POA: Yes/No Measurement: 4cm x 5.5cm x 0.2cm  Wound bed:50% dark, stable, black.  50% pink. Not fluctuant  Drainage (amount, consistency, odor) minimal, serosanguinous  Periwound: some maceration noted Dressing procedure/placement/frequency: Add Prevalon boot for offloading,  Mrs. Dayton ScrapeMurray is very debilitated with pain and does not move.  Add silicone foam to protect and insulate for moist wound healing. No debridement at this time due to eschar stable and non fluctuant. Prevalon boot ordered.   Discussed POC with  bedside nurse.  Re consult if needed, will not follow at this time. Thanks  Brendia Dampier [employer] RN, CWOCN [phone])

## 2014-12-18 NOTE — Progress Notes (Signed)
INFECTIOUS DISEASE PROGRESS NOTE  ID: Kellie Acosta is a 69 y.o. female with  Active Problems:   Discitis   COPD (chronic obstructive pulmonary disease) (HCC)   HTN (hypertension)   CAD (coronary artery disease)   Pacemaker   Anxiety state   Arthritis   Diabetes (HCC)   Pressure ulcer   Acute osteomyelitis (HCC)  Subjective: Without complaints  Abtx:  Anti-infectives    Start     Dose/Rate Route Frequency Ordered Stop   12/17/14 1700  vancomycin (VANCOCIN) IVPB 750 mg/150 ml premix     750 mg 150 mL/hr over 60 Minutes Intravenous Every 12 hours 12/17/14 1607     12/17/14 1600  cefTRIAXone (ROCEPHIN) 2 g in dextrose 5 % 50 mL IVPB     2 g 100 mL/hr over 30 Minutes Intravenous Every 24 hours 12/17/14 1539        Medications:  Scheduled: . antiseptic oral rinse  7 mL Mouth Rinse BID  . cefTRIAXone (ROCEPHIN)  IV  2 g Intravenous Q24H  . feeding supplement (ENSURE ENLIVE)  237 mL Oral TID BM  . insulin aspart  0-9 Units Subcutaneous TID WC  . methadone  5 mg Oral BID  . polyethylene glycol  17 g Oral Daily  . potassium chloride  40 mEq Oral BID  . senna-docusate  2 tablet Oral BID  . sodium chloride  3 mL Intravenous Q12H  . vancomycin  750 mg Intravenous Q12H    Objective: Vital signs in last 24 hours: Temp:  [98.2 F (36.8 C)-98.6 F (37 C)] 98.6 F (37 C) (12/09 0952) Pulse Rate:  [85-105] 101 (12/09 0952) Resp:  [8-18] 18 (12/09 0952) BP: (111-158)/(58-73) 158/72 mmHg (12/09 0952) SpO2:  [98 %-100 %] 99 % (12/09 0952) Weight:  [85.3 kg (188 lb 0.8 oz)] 85.3 kg (188 lb 0.8 oz) (12/08 2026)   General appearance: alert and no distress Resp: clear to auscultation bilaterally Cardio: regular rate and rhythm GI: normal findings: bowel sounds normal and soft, non-tender Neurologic: Mental status: when asked if ths is Kellie Acosta she is adamant that she is not today.   Lab Results  Recent Labs  12/17/14 0650 12/18/14 0315  WBC 6.9 6.6  HGB 9.2* 8.3*    HCT 30.3* 26.0*  NA 145 145  K 3.7 3.0*  CL 112* 114*  CO2 25 26  BUN 31* 20  CREATININE 1.08* 0.67   Liver Panel  Recent Labs  12/15/14 1910  PROT 5.8*  ALBUMIN 2.5*  AST 28  ALT 19  ALKPHOS 76  BILITOT 0.5   Sedimentation Rate  Recent Labs  12/15/14 1910  ESRSEDRATE 65*   C-Reactive Protein  Recent Labs  12/15/14 1910  CRP 1.6*    Microbiology: Recent Results (from the past 240 hour(s))  Fungus Culture with Smear     Status: None (Preliminary result)   Collection Time: 12/17/14  3:52 PM  Result Value Ref Range Status   Specimen Description ABSCESS  Final   Special Requests L4 L5 DISC ASPIRATION  Final   Fungal Smear   Final    NO YEAST OR FUNGAL ELEMENTS SEEN Performed at [facility]    Culture   Final    CULTURE IN PROGRESS FOR FOUR WEEKS Performed at [facility]    Report Status PENDING  Incomplete  Culture, routine-abscess     Status: None (Preliminary result)   Collection Time: 12/17/14  3:52 PM  Result Value Ref Range Status  Specimen Description ABSCESS  Final   Special Requests L4 L5 DISC ASPIRATION  Final   Gram Stain   Final    NO WBC SEEN NO SQUAMOUS EPITHELIAL CELLS SEEN NO ORGANISMS SEEN Performed at [facility]    Culture PENDING  Incomplete   Report Status PENDING  Incomplete    Studies/Results: No results found.   Assessment/Plan: Osteomyelitis, Discitis Await aspirate Cx and g/s  Mental Status change Not clear if from medications or infection Not improving  ARF Cr now normal.   Total days of antibiotics: 1 vanco/ceftriaxone         Johny Sax Infectious Diseases (pager) [phone] [email or web address] 12/18/2014, 3:23 PM  LOS: 3 days

## 2014-12-19 ENCOUNTER — Inpatient Hospital Stay (HOSPITAL_COMMUNITY): Payer: Medicare Other

## 2014-12-19 LAB — HEPATIC FUNCTION PANEL
ALT: 12 U/L — AB (ref 14–54)
AST: 20 U/L (ref 15–41)
Albumin: 1.9 g/dL — ABNORMAL LOW (ref 3.5–5.0)
Alkaline Phosphatase: 65 U/L (ref 38–126)
BILIRUBIN DIRECT: 0.1 mg/dL (ref 0.1–0.5)
BILIRUBIN INDIRECT: 0.5 mg/dL (ref 0.3–0.9)
BILIRUBIN TOTAL: 0.6 mg/dL (ref 0.3–1.2)
Total Protein: 4.4 g/dL — ABNORMAL LOW (ref 6.5–8.1)

## 2014-12-19 LAB — GLUCOSE, CAPILLARY
GLUCOSE-CAPILLARY: 129 mg/dL — AB (ref 65–99)
GLUCOSE-CAPILLARY: 133 mg/dL — AB (ref 65–99)
Glucose-Capillary: 121 mg/dL — ABNORMAL HIGH (ref 65–99)
Glucose-Capillary: 120 mg/dL — ABNORMAL HIGH (ref 65–99)

## 2014-12-19 LAB — BASIC METABOLIC PANEL
Anion gap: 8 (ref 5–15)
BUN: 11 mg/dL (ref 6–20)
CHLORIDE: 113 mmol/L — AB (ref 101–111)
CO2: 25 mmol/L (ref 22–32)
Calcium: 8.5 mg/dL — ABNORMAL LOW (ref 8.9–10.3)
Creatinine, Ser: 0.58 mg/dL (ref 0.44–1.00)
GFR calc Af Amer: >60 mL/min (ref >60)
GFR calc non Af Amer: >60 mL/min (ref >60)
GLUCOSE: 96 mg/dL (ref 65–99)
POTASSIUM: 2.9 mmol/L — AB (ref 3.5–5.1)
Sodium: 146 mmol/L — ABNORMAL HIGH (ref 135–145)

## 2014-12-19 LAB — CBC
HEMATOCRIT: 23.5 % — AB (ref 36.0–46.0)
HEMOGLOBIN: 7.5 g/dL — AB (ref 12.0–15.0)
MCH: 26.2 pg (ref 26.0–34.0)
MCHC: 31.9 g/dL (ref 30.0–36.0)
MCV: 82.2 fL (ref 78.0–100.0)
PLATELETS: 98 10*3/uL — AB (ref 150–400)
RBC: 2.86 MIL/uL — AB (ref 3.87–5.11)
RDW: 17.6 % — ABNORMAL HIGH (ref 11.5–15.5)
WBC: 6.3 10*3/uL (ref 4.0–10.5)

## 2014-12-19 LAB — AMMONIA: AMMONIA: 34 umol/L (ref 9–35)

## 2014-12-19 LAB — MAGNESIUM: Magnesium: 1.7 mg/dL (ref 1.7–2.4)

## 2014-12-19 MED ORDER — PREGABALIN 75 MG PO CAPS: 150.0000 mg | ORAL_CAPSULE | Freq: Every day | ORAL | Status: DC

## 2014-12-19 MED ORDER — ASPIRIN EC 81 MG PO TBEC
81.0000 mg | DELAYED_RELEASE_TABLET | Freq: Every day | ORAL | Status: DC
  Administered 2014-12-19 – 2014-12-24 (×6): 81 mg via ORAL
  Filled 2014-12-19 (×6): qty 1

## 2014-12-19 MED ORDER — HALOPERIDOL LACTATE 5 MG/ML IJ SOLN: 2.0000 mg | Freq: Four times a day (QID) | INTRAMUSCULAR | Status: DC | PRN

## 2014-12-19 MED ORDER — TRAZODONE HCL 100 MG PO TABS
100.0000 mg | ORAL_TABLET | Freq: Every day | ORAL | Status: DC
  Administered 2014-12-19 – 2014-12-23 (×6): 100 mg via ORAL
  Filled 2014-12-19 (×6): qty 1

## 2014-12-19 MED ORDER — PREGABALIN 75 MG PO CAPS
150.0000 mg | ORAL_CAPSULE | Freq: Two times a day (BID) | ORAL | Status: DC
  Administered 2014-12-19 – 2014-12-24 (×12): 150 mg via ORAL
  Filled 2014-12-19 (×12): qty 2

## 2014-12-19 MED ORDER — FESOTERODINE FUMARATE ER 4 MG PO TB24
4.0000 mg | ORAL_TABLET | Freq: Every day | ORAL | Status: DC
  Administered 2014-12-19 – 2014-12-24 (×5): 4 mg via ORAL
  Filled 2014-12-19 (×6): qty 1

## 2014-12-19 MED ORDER — LIDOCAINE 5 % EX PTCH: 1.0000 | MEDICATED_PATCH | CUTANEOUS | Status: DC

## 2014-12-19 MED ORDER — POTASSIUM CHLORIDE 10 MEQ/100ML IV SOLN
10.0000 meq | INTRAVENOUS | Status: AC
  Administered 2014-12-19 (×3): 10 meq via INTRAVENOUS
  Filled 2014-12-19 (×2): qty 100

## 2014-12-19 MED ORDER — DEXTROSE-NACL 5-0.45 % IV SOLN
INTRAVENOUS | Status: DC
Start: 2014-12-19 — End: 2014-12-20
  Administered 2014-12-19: 14:00:00 via INTRAVENOUS

## 2014-12-19 MED ORDER — SACCHAROMYCES BOULARDII 250 MG PO CAPS
250.0000 mg | ORAL_CAPSULE | Freq: Every day | ORAL | Status: DC
  Administered 2014-12-19 – 2014-12-24 (×6): 250 mg via ORAL
  Filled 2014-12-19 (×6): qty 1

## 2014-12-19 MED ORDER — ALPRAZOLAM 0.25 MG PO TABS: 0.2500 mg | ORAL_TABLET | Freq: Two times a day (BID) | ORAL | Status: DC | PRN

## 2014-12-19 MED ORDER — SILVER SULFADIAZINE 1 % EX CREA
1.0000 "application " | TOPICAL_CREAM | Freq: Every evening | CUTANEOUS | Status: DC
  Administered 2014-12-20 – 2014-12-23 (×3): 1 via TOPICAL
  Filled 2014-12-19 (×2): qty 85

## 2014-12-19 MED ORDER — ROSUVASTATIN CALCIUM 20 MG PO TABS
20.0000 mg | ORAL_TABLET | Freq: Every day | ORAL | Status: DC
  Administered 2014-12-19 – 2014-12-24 (×6): 20 mg via ORAL
  Filled 2014-12-19 (×6): qty 1

## 2014-12-19 MED ORDER — TIOTROPIUM BROMIDE MONOHYDRATE 18 MCG IN CAPS
18.0000 ug | ORAL_CAPSULE | Freq: Every day | RESPIRATORY_TRACT | Status: DC
  Administered 2014-12-19 – 2014-12-23 (×4): 18 ug via RESPIRATORY_TRACT
  Filled 2014-12-19: qty 5

## 2014-12-19 NOTE — [place] FL2 (Signed)
MEDICAID FL2 LEVEL OF CARE SCREENING TOOL     IDENTIFICATION  Patient Name: Kellie Acosta Neidhardt Birthdate: [birth year] Sex: female Admission Date (Current Location): 12/15/2014  [facility] [place]     Facility and Address:  [facility] [place], [address], [place], [place] [address]      Provider Number: [id number]  Attending Physician Name and Address:  Kathlen ModyVijaya Akula, MD  Relative Name and Phone Number:  Husband: Raiford NobleRick    Current Level of Care: Hospital Recommended Level of Care: Nursing Facility Prior Approval Number:    Date Approved/Denied:   PASRR Number:    Discharge Plan: SNF    Current Diagnoses: Patient Active Problem List   Diagnosis Date Noted  . Acute osteomyelitis (HCC)   . Pressure ulcer 12/16/2014  . Discitis 12/15/2014  . COPD (chronic obstructive pulmonary disease) (HCC) 12/15/2014  . HTN (hypertension) 12/15/2014  . CAD (coronary artery disease) 12/15/2014  . Pacemaker 12/15/2014  . Anxiety state 12/15/2014  . Arthritis 12/15/2014  . Diabetes (HCC) 12/15/2014    Orientation RESPIRATION BLADDER Height & Weight    Self  O2 (2L) Continent 5\' 6"  (167.6 cm) 188 lbs.  BEHAVIORAL SYMPTOMS/MOOD NEUROLOGICAL BOWEL NUTRITION STATUS      Continent Diet (Dysphagia 1 (puree);Thin liquid)  AMBULATORY STATUS COMMUNICATION OF NEEDS Skin   Extensive Assist Verbally PU Stage and Appropriate Care, Surgical wounds (Left Heel unstagable) PU Stage 1 Dressing: Daily (gauze, clean to dry)                     Personal Care Assistance Level of Assistance  Bathing, Feeding, Dressing, Total care Bathing Assistance: Limited assistance Feeding assistance: Limited assistance Dressing Assistance: Limited assistance Total Care Assistance: Limited assistance   Functional Limitations Info  Speech     Speech Info: Impaired    SPECIAL CARE FACTORS FREQUENCY  PT (By licensed PT), OT (By licensed OT), Speech therapy     PT  Frequency: 3 OT Frequency: 3     Speech Therapy Frequency: 3      Contractures Contractures Info: Not present    Additional Factors Info  Code Status, Allergies, Psychotropic, Insulin Sliding Scale, Isolation Precautions Code Status Info: Full Allergies Info: Codeine, Meperidine And Related, Penicillins, Pentazocine Psychotropic Info: Lyrica Insulin Sliding Scale Info: Novolog 3x day Isolation Precautions Info: None in hospital     Current Medications (12/19/2014):  This is the current hospital active medication list Current Facility-Administered Medications  Medication Dose Route Frequency Provider Last Rate Last Dose  . ALPRAZolam (XANAX) tablet 0.25 mg  0.25 mg Oral BID PRN Leatha Gildingostin M Gherghe, MD      . alum & mag hydroxide-simeth (MAALOX/MYLANTA) 200-200-20 MG/5ML suspension 30 mL  30 mL Oral Q6H PRN Costin Otelia SergeantM Gherghe, MD      . antiseptic oral rinse (CPC / CETYLPYRIDINIUM CHLORIDE 0.05%) solution 7 mL  7 mL Mouth Rinse BID Kathlen ModyVijaya Akula, MD   7 mL at 12/18/14 2200  . aspirin EC tablet 81 mg  81 mg Oral Daily Kathlen ModyVijaya Akula, MD      . cefTRIAXone (ROCEPHIN) 2 g in dextrose 5 % 50 mL IVPB  2 g Intravenous Q24H Ginnie SmartJeffrey C Hatcher, MD   2 g at 12/18/14 1753  . feeding supplement (ENSURE ENLIVE) (ENSURE ENLIVE) liquid 237 mL  237 mL Oral TID BM Marinell BlightStephanie L Craig, RD   237 mL at 12/18/14 1729  . fesoterodine (TOVIAZ) tablet 4 mg  4 mg Oral Daily  Leatha Gilding, MD      . insulin aspart (novoLOG) injection 0-9 Units  0-9 Units Subcutaneous TID WC Costin Otelia Sergeant, MD   1 Units at 12/18/14 1330  . LORazepam (ATIVAN) injection 1 mg  1 mg Intravenous Q4H PRN Kathlen Mody, MD   1 mg at 12/19/14 1610  . methadone (DOLOPHINE) tablet 5 mg  5 mg Oral BID Kathlen Mody, MD   5 mg at 12/18/14 2015  . ondansetron (ZOFRAN) tablet 4 mg  4 mg Oral Q6H PRN Costin Otelia Sergeant, MD       Or  . ondansetron (ZOFRAN) injection 4 mg  4 mg Intravenous Q6H PRN Leatha Gilding, MD   4 mg at 12/19/14 9604  .  polyethylene glycol (MIRALAX / GLYCOLAX) packet 17 g  17 g Oral Daily Kathlen Mody, MD   17 g at 12/18/14 1400  . potassium chloride 10 mEq in 100 mL IVPB  10 mEq Intravenous Q1 Hr x 3 Kathlen Mody, MD      . pregabalin (LYRICA) capsule 150 mg  150 mg Oral BID Kathlen Mody, MD   150 mg at 12/19/14 0133  . rosuvastatin (CRESTOR) tablet 20 mg  20 mg Oral Daily Costin Otelia Sergeant, MD      . saccharomyces boulardii (FLORASTOR) capsule 250 mg  250 mg Oral Daily Kathlen Mody, MD      . senna-docusate (Senokot-S) tablet 2 tablet  2 tablet Oral BID Kathlen Mody, MD   2 tablet at 12/18/14 1400  . silver sulfADIAZINE (SILVADENE) 1 % cream 1 application  1 application Topical QPM Kathlen Mody, MD      . sodium chloride 0.9 % injection 3 mL  3 mL Intravenous Q12H Leatha Gilding, MD   3 mL at 12/18/14 2034  . tiotropium (SPIRIVA) inhalation capsule 18 mcg  18 mcg Inhalation Daily Leatha Gilding, MD   18 mcg at 12/19/14 0910  . traMADol (ULTRAM) tablet 50 mg  50 mg Oral Q6H PRN Kathlen Mody, MD      . traZODone (DESYREL) tablet 100 mg  100 mg Oral QHS Kathlen Mody, MD   100 mg at 12/19/14 0133  . vancomycin (VANCOCIN) IVPB 750 mg/150 ml premix  750 mg Intravenous Q12H Robinette Haines, RPH   750 mg at 12/19/14 5409     Discharge Medications: Please see discharge summary for a list of discharge medications.  Relevant Imaging Results:  Relevant Lab Results:   Additional Information current resident at [facility], [place], [place]

## 2014-12-19 NOTE — Progress Notes (Addendum)
[facility] PROGRESS NOTE  Kellie Acosta ZOX:[id number] DOB: 14-Jan-1945 DOA: 12/15/2014 PCP: No primary care provider on file. Brief history: Kellie Acosta is a 69 y.o. female has a past medical history significant for hypertension, hyperlipidemia, polypharmacy, prior history of stroke, prior history of paroxysmal A. fib now with a pacemaker, CHF, anxiety, dementia, is being direct admitted from [facility] emergency room with a chief complaint back pain. She underwent a back surgery in [place] by Dr Loralie Champagne in 06/2014 and was transferred to rehab and since then, she had been bed bound, had more falls and has been lethargic from the pain meds. The family reports she has no appetite and doesn't eat and does n't participate in therapy.  She also has been on and off antibiotics since then. She presented to [facility] with severe back and was transferred here for further evaluation.  On arrival to [facility], she was encephalopathic probably from the medications, confused and hypotensive. She responded briefly to narcan, but became very agitated and we had to give her benzodiazepines. CT lumbar spine ordered which showed progression of erosive destructive changes at the L4 L5 level consistent with persistent and worsened discitis osteomyelitis.  the IR and ID consulted for recommendations. She underwent  IR guided lumbar disc aspiration on 12/8 and antibiotics were started after the aspiration by ID.  Speech eval recommended dysphagia 1 diet with thin liquids.  12/10  Patient remains confused and CT head without contrast is unremarkable. EEG ordered.    Assessment/Plan:                                 Acute on chronic osteomyelitis: CT lumbar spine, showed progression and worsening of the osteomyelitis at L4 AND l5.  - IR consulted for  Lumbar disc aspiration, which was done on 12/8 and  Antibiotics started by ID after the aspiration.  - pain control and start physical therapy.  - awaiting the  culture report from the disc aspiration, determine the duration of the antibiotics.     Acute encephalopathy:  Initially thought she was delirius from medication withdrawal.  Restarted methadone at a lower dose and continue with xanax.  Prn haldol for agitation. Her confusion persists,. Her VIT b12 and TSH within normal limits. CT head without contrast on 12/9 does not show any acute stroke. MRI couldn't be done because of the pacemaker and neurology consulted for recommendations. EEG also ordered.   Acute renal failure:  IMPROVED with hydration. Monitor renal parameters.   Drop in hemoglobin / anemia of unclear etiology: - normocytic, get anemia panel, stool for occult blood ordered.   COPD: No wheezing heard.  Resume nebs as needed.   Acute respiratory failure with hypoxia: probably from use of pain meds and sedatives.  Portable CXR ordered for follow up.  Wean her off the oxygen.    Hypokalemia: replete as needed. Repeat in am.  Hypomagnesemia: Replete as needed.   Hypernatremia:  Probably from decreased po intake, she refuses to eat, and started her on dextrose fluids.   Unstageable pressure injury left heel: Wound care consulted. silicone foam to protect and insulate for moist wound healing. No debridement at this time due to eschar stable and non fluctuant. Prevalon boot ordered by wound care.   Code Status: FULL CODE.  Family Communication: FAMILY AT BEDSIDE.  Disposition Plan: SNF when she infection improves    Consultants: IR ID NEUROLOGY WOUND CARE PHYSICAL THERAPY  SPEECH EVALUATION  Procedures:  IR lumbar Disc aspiration on 12/8  CT head  EEG  Antibiotics  Vancomycin12/8   Rocephin.   HPI/Subjective: More confused today, and agitated and asking for pain meds.  Objective: Filed Vitals:   12/19/14 0451 12/19/14 0809  BP: 90/48 118/67  Pulse: 79 104  Temp: 98.7 F (37.1 C) 98.7 F (37.1 C)  Resp: 20 19    Intake/Output Summary (Last 24  hours) at 12/19/14 1154 Last data filed at 12/19/14 1012  Gross per 24 hour  Intake    120 ml  Output    650 ml  Net   -530 ml   Filed Weights   12/16/14 2055 12/17/14 2026 12/18/14 2015  Weight: 85.5 kg (188 lb 7.9 oz) 85.3 kg (188 lb 0.8 oz) 85.6 kg (188 lb 11.4 oz)    Exam:   General:  Alert and confused.   Cardiovascular: s1s2  Respiratory: ctab  Abdomen: soft NT ND bs+  Musculoskeletal: trace pedal edema.   Data Reviewed: Basic Metabolic Panel:  Recent Labs Lab 12/15/14 1910 12/16/14 0525 12/17/14 0650 12/18/14 0315 12/18/14 1347 12/19/14 0600  NA 143 143 145 145  --  146*  K 3.6 3.3* 3.7 3.0*  --  2.9*  CL 107 108 112* 114*  --  113*  CO2 25 28 25 26   --  25  GLUCOSE 114* 91 83 120*  --  96  BUN 38* 39* 31* 20  --  11  CREATININE 1.34* 1.48* 1.08* 0.67  --  0.58  CALCIUM 8.9 8.5* 8.8* 8.8*  --  8.5*  MG  --   --   --   --  1.5*  --    Liver Function Tests:  Recent Labs Lab 12/15/14 1910  AST 28  ALT 19  ALKPHOS 76  BILITOT 0.5  PROT 5.8*  ALBUMIN 2.5*   No results for input(s): LIPASE, AMYLASE in the last 168 hours. No results for input(s): AMMONIA in the last 168 hours. CBC:  Recent Labs Lab 12/15/14 1910 12/16/14 0525 12/17/14 0650 12/18/14 0315 12/19/14 0600  WBC 13.5* 12.5* 6.9 6.6 6.3  NEUTROABS 10.6*  --   --   --   --   HGB 10.5* 7.8* 9.2* 8.3* 7.5*  HCT 35.5* 25.9* 30.3* 26.0* 23.5*  MCV 87.9 86.0 84.9 82.8 82.2  PLT 206 134* 117* 106* 98*   Cardiac Enzymes: No results for input(s): CKTOTAL, CKMB, CKMBINDEX, TROPONINI in the last 168 hours. BNP (last 3 results) No results for input(s): BNP in the last 8760 hours.  ProBNP (last 3 results) No results for input(s): PROBNP in the last 8760 hours.  CBG:  Recent Labs Lab 12/18/14 0807 12/18/14 1211 12/18/14 1711 12/18/14 2011 12/19/14 0808  GLUCAP 129* 136* 122* 126* 121*    Recent Results (from the past 240 hour(s))  Fungus Culture with Smear     Status: None  (Preliminary result)   Collection Time: 12/17/14  3:52 PM  Result Value Ref Range Status   Specimen Description ABSCESS  Final   Special Requests L4 L5 DISC ASPIRATION  Final   Fungal Smear   Final    NO YEAST OR FUNGAL ELEMENTS SEEN Performed at [facility]    Culture   Final    CULTURE IN PROGRESS FOR FOUR WEEKS Performed at [facility]    Report Status PENDING  Incomplete  Culture, routine-abscess     Status: None (Preliminary result)   Collection Time: 12/17/14  3:52 PM  Result Value Ref Range Status   Specimen Description ABSCESS  Final   Special Requests L4 L5 DISC ASPIRATION  Final   Gram Stain   Final    NO WBC SEEN NO SQUAMOUS EPITHELIAL CELLS SEEN NO ORGANISMS SEEN Performed at [facility]    Culture   Final    NO GROWTH 1 DAY Performed at [facility]    Report Status PENDING  Incomplete     Studies: Ct Head Wo Contrast  12/19/2014  CLINICAL DATA:  Encephalopathic, confusion and hypotensive, possibly due to medication. History of hypertension, stroke, diabetes, dementia. EXAM: CT HEAD WITHOUT CONTRAST TECHNIQUE: Contiguous axial images were obtained from the base of the skull through the vertex without intravenous contrast. COMPARISON:  CT head November 06, 2014 FINDINGS: The ventricles and sulci are normal for age. No intraparenchymal hemorrhage, mass effect nor midline shift. Patchy supratentorial white matter hypodensities are within normal range for patient's age and though non-specific suggest sequelae of chronic small vessel ischemic disease. No acute large vascular territory infarcts. No abnormal extra-axial fluid collections. Basal cisterns are patent. Mild calcific atherosclerosis of the carotid siphons. No skull fracture. 8 mm bony excrescence inner table RIGHT temporal calvarium likely represents a meningioma without mass effect. The included ocular globes and orbital contents are non-suspicious. Paranasal sinus are well  aerated. Status post bilateral wall up mastoidectomy's, increasing soft tissue within the LEFT surgical bed, the RIGHT is well-aerated. IMPRESSION: No acute intracranial process. Involutional changes. Mild to moderate chronic small vessel ischemic disease. Status post bilateral wall up mastoidectomy's. Increasing soft tissue within LEFT surgical bed, recommend ENT consultation on a nonemergent basis. Electronically Signed   By: Awilda Metro M.D.   On: 12/19/2014 01:38    Scheduled Meds: . antiseptic oral rinse  7 mL Mouth Rinse BID  . aspirin EC  81 mg Oral Daily  . cefTRIAXone (ROCEPHIN)  IV  2 g Intravenous Q24H  . feeding supplement (ENSURE ENLIVE)  237 mL Oral TID BM  . fesoterodine  4 mg Oral Daily  . insulin aspart  0-9 Units Subcutaneous TID WC  . methadone  5 mg Oral BID  . polyethylene glycol  17 g Oral Daily  . potassium chloride  10 mEq Intravenous Q1 Hr x 3  . pregabalin  150 mg Oral BID  . rosuvastatin  20 mg Oral Daily  . saccharomyces boulardii  250 mg Oral Daily  . senna-docusate  2 tablet Oral BID  . silver sulfADIAZINE  1 application Topical QPM  . sodium chloride  3 mL Intravenous Q12H  . tiotropium  18 mcg Inhalation Daily  . traZODone  100 mg Oral QHS  . vancomycin  750 mg Intravenous Q12H   Continuous Infusions:    Active Problems:   Discitis   COPD (chronic obstructive pulmonary disease) (HCC)   HTN (hypertension)   CAD (coronary artery disease)   Pacemaker   Anxiety state   Arthritis   Diabetes (HCC)   Pressure ulcer   Acute osteomyelitis (HCC)    Time spent: 25 minutes    Parissa Chiao  [facility] Pager [phone]. If 7PM-7AM, please contact night-coverage at [email or web address], password [facility] 12/19/2014, 11:54 AM  LOS: 4 days

## 2014-12-19 NOTE — Progress Notes (Signed)
LCSW attempting to complete psychosocial assessment and family needs. No family at bed side. Pt is sleeping in room and not waking when prompted.    Calls placed to phone numbers on facesheet [facility]  [facility] resident in the rehab facility.  LCSW attempted to contact husband but number is not correct on facesheet.    If family arrives at hospital, LCSW will complete assessment.  FL2 has been updated and co-sign needed for time of DC in case pt will return to SNF.  Will update week day SW.  Deretha EmoryHannah Deshane Cotroneo LCSW, MSW Clinical Social Work: Emergency Room [phone]

## 2014-12-19 NOTE — Consult Note (Signed)
Reason for Consult: AMS Referring Physician: Dr Blake Divine  CC: Confusion  HPI: Kellie Acosta is a 69 y.o. female with paroxysmal atrial fibrillation status post permanent pacemaker, hypertension, syncope, previous stroke, congestive heart failure, diabetes mellitus, dementia, anxiety, and fibromyalgia admitted to [facility], 12/15/2014, in transfer from [facility] for evaluation of back pain. The patient had undergone a lumbar laminectomy performed in [place] in June of this year. Imaging at [facility] was consistent with osteomyelitis. The patient has been on antibiotics and pain medications. She was found to be encephalopathic on arrival to [facility] which was felt to be secondary to pain medication withdrawal. She had a lumbar disc aspiration and is now on antibiotic therapy per infectious disease. A CT scan of the head on 12/19/2014 was unremarkable. An MRI could not be performed secondary to permanent pacemaker. Neurology was asked to evaluate the patient. Skilled nursing facility placement is planned. I was not able to get in touch with the patient's husband at this time. I did speak with the supervisor at [facility] where the patient is a resident. The supervisor told me that the patient's level of orientation is variable. She does have dementia, although mild, it seems to fluctuate with how the patient happens to be feeling. She is much worse any time she has a urinary tract infection.  Past Medical History  Diagnosis Date  . PAF (paroxysmal atrial fibrillation) (HCC)     pacemaker  . HTN (hypertension)   . Syncope   . Polypharmacy   . Myocardial infarction (HCC)   . Presence of permanent cardiac pacemaker   . Stroke (HCC)   . CHF (congestive heart failure) (HCC)   . Shortness of breath dyspnea   . Diabetes mellitus without complication (HCC)   . Depression   . Dementia   . Anxiety   . GERD (gastroesophageal reflux disease)   . Arthritis   .  Fibromyalgia     Past Surgical History  Procedure Laterality Date  . Insert / replace / remove pacemaker    . Back surgery  06/2014,07/2014    LUMBAR LAMINECTOMY L4-L5  . Appendectomy    . Abdominal hysterectomy      Family History  Problem Relation Age of Onset  . Heart disease    . Cancer    . Heart disease Mother   . Heart disease Father   . Heart disease Sister   . Heart disease Brother     Social History:  reports that she has never smoked. She does not have any smokeless tobacco history on file. She reports that she does not drink alcohol or use illicit drugs.  Allergies  Allergen Reactions  . Codeine   . Meperidine And Related   . Penicillins Itching  . Pentazocine     Medications:  Scheduled: . antiseptic oral rinse  7 mL Mouth Rinse BID  . aspirin EC  81 mg Oral Daily  . cefTRIAXone (ROCEPHIN)  IV  2 g Intravenous Q24H  . feeding supplement (ENSURE ENLIVE)  237 mL Oral TID BM  . fesoterodine  4 mg Oral Daily  . insulin aspart  0-9 Units Subcutaneous TID WC  . methadone  5 mg Oral BID  . polyethylene glycol  17 g Oral Daily  . pregabalin  150 mg Oral BID  . rosuvastatin  20 mg Oral Daily  . saccharomyces boulardii  250 mg Oral Daily  . senna-docusate  2 tablet Oral BID  . silver sulfADIAZINE  1 application Topical QPM  . sodium chloride  3 mL Intravenous Q12H  . tiotropium  18 mcg Inhalation Daily  . traZODone  100 mg Oral QHS  . vancomycin  750 mg Intravenous Q12H    ROS: Unobtainable secondary to the patient's confusion.     Physical Examination: Blood pressure 102/62, pulse 97, temperature 97.5 F (36.4 C), temperature source Oral, resp. rate 18, height  (1.676 m), weight 85.6 kg (188 lb 11.4 oz), SpO2 98 %.   General -  69 year old female in bed in no acute distress. Very slow to move. Very slow to respond to any questions. Heart - Regular rate and rhythm  Lungs - Clear to auscultation Abdomen - Soft - diffuse tenderness Extremities -  pulses were weak to absent. 1+ edema bilaterally Skin - Warm and dry   Neurologic Examination  Mental Status: The patient is somewhat lethargic. She does open her eyes and make eye contact. She responds to some simple commands. She was able to tell me her name but was otherwise disoriented. She is very slow to speak and does not attempt to respond to most questions. Cranial Nerves: II: Discs not visualized; Visual fields and extraocular movements could not be tested. III,IV, VI: ptosis not present, extra-ocular motions intact bilaterally V,VII: smile symmetric, facial light touch sensation normal bilaterally VIII: hearing normal bilaterally IX,X: gag reflex present XI: bilateral shoulder shrug - patient did not attempt XII: midline tongue extension Motor: The patient was able to move both upper extremities against gravity. She was able to wiggle her toes but not able to lift either lower extremity. When moved passively she cried out in pain. Tone and bulk:normal tone throughout; no atrophy noted Sensory: Sensation appears to be intact in all extremities. Deep Tendon Reflexes: 2+ both upper extremities. Unable to elicit reflexes in the lower extremities. Plantars: Right: mute   Left: mute Cerebellar: Able to perform finger to nose testing with maximal cueing. Unable to perform heel-to-shin. Gait: Gait was not attempted for safety reasons     Laboratory Studies:   Basic Metabolic Panel:  Recent Labs Lab 12/15/14 1910 12/16/14 0525 12/17/14 0650 12/18/14 0315 12/18/14 1347 12/19/14 0600 12/19/14 1250  NA 143 143 145 145  --  146*  --   K 3.6 3.3* 3.7 3.0*  --  2.9*  --   CL 107 108 112* 114*  --  113*  --   CO2 --  25  --   GLUCOSE 114* 91 83 120*  --  96  --   BUN 38* 39* 31* 20  --  11  --   CREATININE 1.34* 1.48* 1.08* 0.67  --  0.58  --   CALCIUM 8.9 8.5* 8.8* 8.8*  --  8.5*  --   MG  --   --   --   --  1.5*  --  1.7    Liver Function Tests:  Recent  Labs Lab 12/15/14 1910  AST 28  ALT 19  ALKPHOS 76  BILITOT 0.5  PROT 5.8*  ALBUMIN 2.5*   No results for input(s): LIPASE, AMYLASE in the last 168 hours. No results for input(s): AMMONIA in the last 168 hours.  CBC:  Recent Labs Lab 12/15/14 1910 12/16/14 0525 12/17/14 0650 12/18/14 0315 12/19/14 0600  WBC 13.5* 12.5* 6.9 6.6 6.3  NEUTROABS 10.6*  --   --   --   --   HGB 10.5* 7.8* 9.2* 8.3* 7.5*  HCT  35.5* 25.9* 30.3* 26.0* 23.5*  MCV 87.9 86.0 84.9 82.8 82.2  PLT 206 134* 117* 106* 98*    Cardiac Enzymes: No results for input(s): CKTOTAL, CKMB, CKMBINDEX, TROPONINI in the last 168 hours.  BNP: Invalid input(s): POCBNP  CBG:  Recent Labs Lab 12/18/14 1211 12/18/14 1711 12/18/14 2011 12/19/14 0808 12/19/14 1140  GLUCAP 136* 122* 126* 121* 129*    Microbiology: Results for orders placed or performed during the hospital encounter of 12/15/14  Fungus Culture with Smear     Status: None (Preliminary result)   Collection Time: 12/17/14  3:52 PM  Result Value Ref Range Status   Specimen Description ABSCESS  Final   Special Requests L4 L5 DISC ASPIRATION  Final   Fungal Smear   Final    NO YEAST OR FUNGAL ELEMENTS SEEN Performed at [facility]    Culture   Final    CULTURE IN PROGRESS FOR FOUR WEEKS Performed at [facility]    Report Status PENDING  Incomplete  Culture, routine-abscess     Status: None (Preliminary result)   Collection Time: 12/17/14  3:52 PM  Result Value Ref Range Status   Specimen Description ABSCESS  Final   Special Requests L4 L5 DISC ASPIRATION  Final   Gram Stain   Final    NO WBC SEEN NO SQUAMOUS EPITHELIAL CELLS SEEN NO ORGANISMS SEEN Performed at [facility]    Culture   Final    NO GROWTH 1 DAY Performed at [facility]    Report Status PENDING  Incomplete    Coagulation Studies: No results for input(s): LABPROT, INR in the last 72 hours.  Urinalysis:  Recent Labs Lab  12/16/14 0205  COLORURINE YELLOW  LABSPEC 1.014  PHURINE 5.0  GLUCOSEU NEGATIVE  HGBUR LARGE*  BILIRUBINUR NEGATIVE  KETONESUR NEGATIVE  PROTEINUR 30*  NITRITE NEGATIVE  LEUKOCYTESUR TRACE*    Lipid Panel:  No results found for: CHOL, TRIG, HDL, CHOLHDL, VLDL, LDLCALC  HgbA1C:  Lab Results  Component Value Date   HGBA1C 6.1* 12/15/2014    Urine Drug Screen:  No results found for: LABOPIA, COCAINSCRNUR, LABBENZ, AMPHETMU, THCU, LABBARB  Alcohol Level: No results for input(s): ETH in the last 168 hours.  Other results: EKG: Atrial pacing rate 71 bpm  Imaging:   Ct Head Wo Contrast 12/19/2014   No acute intracranial process. Involutional changes. Mild to moderate chronic small vessel ischemic disease. Status post bilateral wall up mastoidectomy's. Increasing soft tissue within LEFT surgical bed, recommend ENT consultation on a nonemergent basis.     Dg Chest Port 1 View 12/19/2014   Low lung volumes.  No acute findings. 8     Assessment/Plan:  Lethargy and confusion. The patient's nurse and the nursing assistant today state that the patient has been in this condition most of the day. They were unable to say if this was a significant change from her usual status. I could not reach the patient's husband. I [facility]. Apparently the patient's level of confusion varies from day to day. The patient's head CT shows no acute changes. She is currently being treated for osteomyelitis. The infection may be playing a role here along with pain medications and benzodiazepines. Consider repeating the CT scan at some point. The patient is unable to have an MRI secondary to a permanent pacemaker.   Further assessment and plan to follow from the attending neurologist. Onalee Huaavid  Rinehuls PA-C [facility] Pager [phone] 12/19/2014, 5:39 PM   Patient seen and evaluated. Agree with above assessment and  plan. The patient has dementia, unclear what true baseline is. Suspect that polypharmacy is likely playing a role in her symptoms. Patient is currently on Lyrica 150mg  BID, trazodone 100mg  QHS, methadone 5mg  daily and prn dosing of ativan, versed and ultram. Of note she also was started on ceftriaxone so question if this may represent a cephalosporin induced encephalopathy. With hx of A fib cannot rule out embolic shower though less likely based on exam findings.   -EEG -check ammonia, hepatic function  -depending on EEG results would consider changing ceftriaxone with ID input -limit sedating medications -will continue to follow  Elspeth Cho, DO [facility] [phone]  If 7pm- 7am, please page neurology on call as listed in AMION.

## 2014-12-20 LAB — BASIC METABOLIC PANEL
Anion gap: 6 (ref 5–15)
BUN: 9 mg/dL (ref 6–20)
CALCIUM: 8.4 mg/dL — AB (ref 8.9–10.3)
CO2: 26 mmol/L (ref 22–32)
CREATININE: 0.64 mg/dL (ref 0.44–1.00)
Chloride: 112 mmol/L — ABNORMAL HIGH (ref 101–111)
Glucose, Bld: 110 mg/dL — ABNORMAL HIGH (ref 65–99)
Potassium: 3.2 mmol/L — ABNORMAL LOW (ref 3.5–5.1)
SODIUM: 144 mmol/L (ref 135–145)

## 2014-12-20 LAB — CBC
HCT: 25.3 % — ABNORMAL LOW (ref 36.0–46.0)
HEMOGLOBIN: 7.9 g/dL — AB (ref 12.0–15.0)
MCH: 26.2 pg (ref 26.0–34.0)
MCHC: 31.2 g/dL (ref 30.0–36.0)
MCV: 83.8 fL (ref 78.0–100.0)
Platelets: 102 10*3/uL — ABNORMAL LOW (ref 150–400)
RBC: 3.02 MIL/uL — ABNORMAL LOW (ref 3.87–5.11)
RDW: 18.7 % — AB (ref 11.5–15.5)
WBC: 6.4 10*3/uL (ref 4.0–10.5)

## 2014-12-20 LAB — URINALYSIS, ROUTINE W REFLEX MICROSCOPIC
BILIRUBIN URINE: NEGATIVE
GLUCOSE, UA: NEGATIVE mg/dL
KETONES UR: 15 mg/dL — AB
NITRITE: NEGATIVE
PH: 6 (ref 5.0–8.0)
PROTEIN: 100 mg/dL — AB
Specific Gravity, Urine: 1.024 (ref 1.005–1.030)

## 2014-12-20 LAB — GLUCOSE, CAPILLARY
GLUCOSE-CAPILLARY: 160 mg/dL — AB (ref 65–99)
Glucose-Capillary: 103 mg/dL — ABNORMAL HIGH (ref 65–99)
Glucose-Capillary: 114 mg/dL — ABNORMAL HIGH (ref 65–99)
Glucose-Capillary: 94 mg/dL (ref 65–99)

## 2014-12-20 LAB — URINE MICROSCOPIC-ADD ON: Bacteria, UA: NONE SEEN

## 2014-12-20 LAB — VANCOMYCIN, TROUGH: VANCOMYCIN TR: 26 ug/mL — AB (ref 10.0–20.0)

## 2014-12-20 LAB — LACTIC ACID, PLASMA: LACTIC ACID, VENOUS: 2.2 mmol/L — AB (ref 0.5–2.0)

## 2014-12-20 LAB — VANCOMYCIN, RANDOM: VANCOMYCIN RM: 20 ug/mL

## 2014-12-20 MED ORDER — POTASSIUM CHLORIDE CRYS ER 20 MEQ PO TBCR
40.0000 meq | EXTENDED_RELEASE_TABLET | Freq: Two times a day (BID) | ORAL | Status: AC
  Administered 2014-12-20 (×2): 40 meq via ORAL
  Filled 2014-12-20 (×2): qty 2

## 2014-12-20 MED ORDER — VANCOMYCIN HCL 10 G IV SOLR
1250.0000 mg | INTRAVENOUS | Status: DC
  Administered 2014-12-21 – 2014-12-23 (×2): 1250 mg via INTRAVENOUS
  Filled 2014-12-20 (×2): qty 1250

## 2014-12-20 MED ORDER — SODIUM CHLORIDE 0.9 % IV BOLUS (SEPSIS)
500.0000 mL | Freq: Once | INTRAVENOUS | Status: AC
  Administered 2014-12-20: 500 mL via INTRAVENOUS

## 2014-12-20 MED ORDER — ALPRAZOLAM 0.25 MG PO TABS
0.2500 mg | ORAL_TABLET | Freq: Every evening | ORAL | Status: DC | PRN
  Administered 2014-12-21: 0.25 mg via ORAL
  Filled 2014-12-20: qty 1

## 2014-12-20 MED ORDER — MAGNESIUM SULFATE 2 GM/50ML IV SOLN
2.0000 g | Freq: Once | INTRAVENOUS | Status: AC
  Administered 2014-12-20: 2 g via INTRAVENOUS
  Filled 2014-12-20: qty 50

## 2014-12-20 MED ORDER — SODIUM CHLORIDE 0.9 % IV BOLUS (SEPSIS)
1000.0000 mL | Freq: Once | INTRAVENOUS | Status: AC
  Administered 2014-12-20: 1000 mL via INTRAVENOUS

## 2014-12-20 MED ORDER — SODIUM CHLORIDE 0.9 % IV SOLN
INTRAVENOUS | Status: DC
  Administered 2014-12-20: 17:00:00 via INTRAVENOUS
  Administered 2014-12-22: 50 mL/h via INTRAVENOUS

## 2014-12-20 NOTE — Progress Notes (Signed)
[facility] PROGRESS NOTE  Kellie Acosta ZOX:[id number] DOB: 03-Nov-1945 DOA: 12/15/2014 PCP: No primary care provider on file. Brief history: Kellie Acosta is a 69 y.o. female has a past medical history significant for hypertension, hyperlipidemia, polypharmacy, prior history of stroke, prior history of paroxysmal A. fib now with a pacemaker, CHF, anxiety, dementia, is being direct admitted from [facility] emergency room with a chief complaint back pain. She underwent a back surgery in [place] by Dr Loralie Champagne in 06/2014 and was transferred to rehab and since then, she had been bed bound, had more falls and has been lethargic from the pain meds. The family reports she has no appetite and doesn't eat and does n't participate in therapy.  She also has been on and off antibiotics since then. She presented to [facility] with severe back and was transferred here for further evaluation.  On arrival to [facility], she was encephalopathic probably from the medications, confused and hypotensive. She responded briefly to narcan, but became very agitated and we had to give her benzodiazepines. CT lumbar spine ordered which showed progression of erosive destructive changes at the L4 L5 level consistent with persistent and worsened discitis osteomyelitis.  the IR and ID consulted for recommendations. She underwent  IR guided lumbar disc aspiration on 12/8 and antibiotics were started after the aspiration by ID.  Speech eval recommended dysphagia 1 diet with thin liquids.  12/10  Patient remains confused and CT head without contrast is unremarkable. EEG ordered.  12/11 hypotension, asymptomatic, one liter fluid bolus given and bp improved. Elevated lactic acid of 2.2.    Assessment/Plan:                                 Acute on chronic osteomyelitis: CT lumbar spine, showed progression and worsening of the osteomyelitis at L4 AND l5.  - IR consulted for  Lumbar disc aspiration, which was done on 12/8 and   Antibiotics started by ID after the aspiration.  - pain control and start physical therapy.  - awaiting the culture report from the disc aspiration, determine the duration of the antibiotics.     Acute encephalopathy:  Initially thought she was delirius from medication withdrawal.  Restarted methadone at a lower dose and continue with xanax.  Prn haldol for agitation. Her confusion persists,. Her VIT b12 and TSH within normal limits. CT head without contrast on 12/9 does not show any acute stroke. MRI couldn't be done because of the pacemaker and neurology consulted for recommendations. EEG also ordered.   Acute renal failure:  IMPROVED with hydration. Monitor renal parameters.   Drop in hemoglobin / anemia of unclear etiology: - normocytic, get anemia panel, stool for occult blood ordered.  Anemia panel reveals normal iron and ferritin, vitamin b12 and folate levels.   COPD: No wheezing heard.  Resume nebs as needed.   Acute respiratory failure with hypoxia: probably from use of pain meds and sedatives.  Portable CXR ordered for follow up.  Wean her off the oxygen.  CXR shows low lung volumes and no acute findings.    Hypokalemia: replete as needed. Repeat in am.  Hypomagnesemia: Replete as needed.   Hypernatremia:  Probably from decreased po intake, she refuses to eat, . Resolved with hydration.   Unstageable pressure injury left heel: Wound care consulted. silicone foam to protect and insulate for moist wound healing. No debridement at this time due to eschar stable and non  fluctuant. Prevalon boot ordered by wound care.   Elevated lactic acid: Fluid boluses ordered and rpeat lactic acid in am.  UA and urine culture ordered.   Code Status: FULL CODE.  Family Communication: none at bedside.   Disposition Plan: SNF when the infection improves    Consultants: IR ID NEUROLOGY WOUND CARE PHYSICAL THERAPY SPEECH EVALUATION  Procedures:  IR lumbar Disc aspiration  on 12/8  CT head  EEG  Antibiotics  Vancomycin12/8   Rocephin.   HPI/Subjective: Arousable, oob in chair today. Objective: Filed Vitals:   12/20/14 1128 12/20/14 1300  BP: 89/44 159/63  Pulse: 82   Temp: 97.2 F (36.2 C)   Resp: 18     Intake/Output Summary (Last 24 hours) at 12/20/14 1657 Last data filed at 12/20/14 1127  Gross per 24 hour  Intake 1388.33 ml  Output      0 ml  Net 1388.33 ml   Filed Weights   12/17/14 2026 12/18/14 2015 12/19/14 2155  Weight: 85.3 kg (188 lb 0.8 oz) 85.6 kg (188 lb 11.4 oz) 84.7 kg (186 lb 11.7 oz)    Exam:   General:  Alert and confused.   Cardiovascular: s1s2  Respiratory: ctab  Abdomen: soft NT ND bs+  Musculoskeletal: trace pedal edema.   Data Reviewed: Basic Metabolic Panel:  Recent Labs Lab 12/16/14 0525 12/17/14 0650 12/18/14 0315 12/18/14 1347 12/19/14 0600 12/19/14 1250 12/20/14 0430  NA 143 145 145  --  146*  --  144  K 3.3* 3.7 3.0*  --  2.9*  --  3.2*  CL 108 112* 114*  --  113*  --  112*  CO2 28 25 26   --  25  --  26  GLUCOSE 91 83 120*  --  96  --  110*  BUN 39* 31* 20  --  11  --  9  CREATININE 1.48* 1.08* 0.67  --  0.58  --  0.64  CALCIUM 8.5* 8.8* 8.8*  --  8.5*  --  8.4*  MG  --   --   --  1.5*  --  1.7  --    Liver Function Tests:  Recent Labs Lab 12/15/14 1910 12/19/14 2120  AST 28 20  ALT 19 12*  ALKPHOS 76 65  BILITOT 0.5 0.6  PROT 5.8* 4.4*  ALBUMIN 2.5* 1.9*   No results for input(s): LIPASE, AMYLASE in the last 168 hours.  Recent Labs Lab 12/19/14 2120  AMMONIA 34   CBC:  Recent Labs Lab 12/15/14 1910 12/16/14 0525 12/17/14 0650 12/18/14 0315 12/19/14 0600 12/20/14 0430  WBC 13.5* 12.5* 6.9 6.6 6.3 6.4  NEUTROABS 10.6*  --   --   --   --   --   HGB 10.5* 7.8* 9.2* 8.3* 7.5* 7.9*  HCT 35.5* 25.9* 30.3* 26.0* 23.5* 25.3*  MCV 87.9 86.0 84.9 82.8 82.2 83.8  PLT 206 134* 117* 106* 98* 102*   Cardiac Enzymes: No results for input(s): CKTOTAL, CKMB,  CKMBINDEX, TROPONINI in the last 168 hours. BNP (last 3 results) No results for input(s): BNP in the last 8760 hours.  ProBNP (last 3 results) No results for input(s): PROBNP in the last 8760 hours.  CBG:  Recent Labs Lab 12/19/14 1140 12/19/14 1603 12/19/14 2149 12/20/14 0722 12/20/14 1132  GLUCAP 129* 133* 120* 103* 160*    Recent Results (from the past 240 hour(s))  Fungus Culture with Smear     Status: None (Preliminary result)   Collection Time:  12/17/14  3:52 PM  Result Value Ref Range Status   Specimen Description ABSCESS  Final   Special Requests L4 L5 DISC ASPIRATION  Final   Fungal Smear   Final    NO YEAST OR FUNGAL ELEMENTS SEEN Performed at [facility]    Culture   Final    CULTURE IN PROGRESS FOR FOUR WEEKS Performed at [facility]    Report Status PENDING  Incomplete  Culture, routine-abscess     Status: None (Preliminary result)   Collection Time: 12/17/14  3:52 PM  Result Value Ref Range Status   Specimen Description ABSCESS  Final   Special Requests L4 L5 DISC ASPIRATION  Final   Gram Stain   Final    NO WBC SEEN NO SQUAMOUS EPITHELIAL CELLS SEEN NO ORGANISMS SEEN Performed at [facility]    Culture   Final    NO GROWTH 2 DAYS Performed at [facility]    Report Status PENDING  Incomplete     Studies: Ct Head Wo Contrast  12/19/2014  CLINICAL DATA:  Encephalopathic, confusion and hypotensive, possibly due to medication. History of hypertension, stroke, diabetes, dementia. EXAM: CT HEAD WITHOUT CONTRAST TECHNIQUE: Contiguous axial images were obtained from the base of the skull through the vertex without intravenous contrast. COMPARISON:  CT head November 06, 2014 FINDINGS: The ventricles and sulci are normal for age. No intraparenchymal hemorrhage, mass effect nor midline shift. Patchy supratentorial white matter hypodensities are within normal range for patient's age and though non-specific suggest sequelae of  chronic small vessel ischemic disease. No acute large vascular territory infarcts. No abnormal extra-axial fluid collections. Basal cisterns are patent. Mild calcific atherosclerosis of the carotid siphons. No skull fracture. 8 mm bony excrescence inner table RIGHT temporal calvarium likely represents a meningioma without mass effect. The included ocular globes and orbital contents are non-suspicious. Paranasal sinus are well aerated. Status post bilateral wall up mastoidectomy's, increasing soft tissue within the LEFT surgical bed, the RIGHT is well-aerated. IMPRESSION: No acute intracranial process. Involutional changes. Mild to moderate chronic small vessel ischemic disease. Status post bilateral wall up mastoidectomy's. Increasing soft tissue within LEFT surgical bed, recommend ENT consultation on a nonemergent basis. Electronically Signed   By: Awilda Metro M.D.   On: 12/19/2014 01:38   Dg Chest Port 1 View  12/19/2014  CLINICAL DATA:  Short of breath, congestive heart failure EXAM: PORTABLE CHEST 1 VIEW COMPARISON:  Radiograph 12/07/2014 FINDINGS: Left-sided pacemaker with 6 continuous leads overlies normal cardiac silhouette. Low lung volumes. No effusion, infiltrate, pneumothorax. IMPRESSION: Low lung volumes.  No acute findings. Electronically Signed   By: Genevive Bi M.D.   On: 12/19/2014 14:28    Scheduled Meds: . antiseptic oral rinse  7 mL Mouth Rinse BID  . aspirin EC  81 mg Oral Daily  . cefTRIAXone (ROCEPHIN)  IV  2 g Intravenous Q24H  . feeding supplement (ENSURE ENLIVE)  237 mL Oral TID BM  . fesoterodine  4 mg Oral Daily  . insulin aspart  0-9 Units Subcutaneous TID WC  . methadone  5 mg Oral BID  . polyethylene glycol  17 g Oral Daily  . potassium chloride  40 mEq Oral BID  . pregabalin  150 mg Oral BID  . rosuvastatin  20 mg Oral Daily  . saccharomyces boulardii  250 mg Oral Daily  . senna-docusate  2 tablet Oral BID  . silver sulfADIAZINE  1 application Topical  QPM  . sodium chloride  3 mL Intravenous Q12H  . tiotropium  18 mcg Inhalation Daily  . traZODone  100 mg Oral QHS   Continuous Infusions:    Active Problems:   Discitis   COPD (chronic obstructive pulmonary disease) (HCC)   HTN (hypertension)   CAD (coronary artery disease)   Pacemaker   Anxiety state   Arthritis   Diabetes (HCC)   Pressure ulcer   Acute osteomyelitis (HCC)    Time spent: 25 minutes    Kellie Acosta  [facility] Pager [phone]. If 7PM-7AM, please contact night-coverage at [email or web address], password [facility] 12/20/2014, 4:57 PM  LOS: 5 days

## 2014-12-20 NOTE — Progress Notes (Signed)
Critical value for Vancomycin trough equals 26. Pharmacy notified, 05:00 dose not given.

## 2014-12-20 NOTE — Progress Notes (Signed)
ANTIBIOTIC CONSULT NOTE - FOLLOW UP  Pharmacy Consult for Vancomycin Indication: Osteomyelitis/discitis  Allergies  Allergen Reactions  . Codeine   . Meperidine And Related   . Penicillins Itching  . Pentazocine     Patient Measurements: Height: 5\' 6"  (167.6 cm) (estimated) Weight: 186 lb 11.7 oz (84.7 kg) IBW/kg (Calculated) : 59.3 Adjusted Body Weight:   Vital Signs: Temp: 98 F (36.7 C) (12/11 1657) Temp Source: Oral (12/11 1657) BP: 130/60 mmHg (12/11 1657) Pulse Rate: 87 (12/11 1657) Intake/Output from previous day: 12/10 0701 - 12/11 0700 In: 1508.3 [P.O.:170; I.V.:788.3; IV Piggyback:550] Out: 150 [Urine:150] Intake/Output from this shift:    Labs:  Recent Labs  12/18/14 0315 12/19/14 0600 12/20/14 0430  WBC 6.6 6.3 6.4  HGB 8.3* 7.5* 7.9*  PLT 106* 98* 102*  CREATININE 0.67 0.58 0.64   Estimated Creatinine Clearance: 70.8 mL/min (by C-G formula based on Cr of 0.64).  Recent Labs  12/20/14 0430 12/20/14 1830  VANCOTROUGH 26*  --   VANCORANDOM  --  20     Microbiology: Recent Results (from the past 720 hour(s))  Fungus Culture with Smear     Status: None (Preliminary result)   Collection Time: 12/17/14  3:52 PM  Result Value Ref Range Status   Specimen Description ABSCESS  Final   Special Requests L4 L5 DISC ASPIRATION  Final   Fungal Smear   Final    NO YEAST OR FUNGAL ELEMENTS SEEN Performed at [facility]    Culture   Final    CULTURE IN PROGRESS FOR FOUR WEEKS Performed at [facility]    Report Status PENDING  Incomplete  Culture, routine-abscess     Status: None (Preliminary result)   Collection Time: 12/17/14  3:52 PM  Result Value Ref Range Status   Specimen Description ABSCESS  Final   Special Requests L4 L5 DISC ASPIRATION  Final   Gram Stain   Final    NO WBC SEEN NO SQUAMOUS EPITHELIAL CELLS SEEN NO ORGANISMS SEEN Performed at [facility]    Culture   Final    NO GROWTH 2 DAYS Performed at  [facility]    Report Status PENDING  Incomplete    Anti-infectives    Start     Dose/Rate Route Frequency Ordered Stop   12/17/14 1700  vancomycin (VANCOCIN) IVPB 750 mg/150 ml premix  Status:  Discontinued     750 mg 150 mL/hr over 60 Minutes Intravenous Every 12 hours 12/17/14 1607 12/20/14 0547   12/17/14 1600  cefTRIAXone (ROCEPHIN) 2 g in dextrose 5 % 50 mL IVPB     2 g 100 mL/hr over 30 Minutes Intravenous Every 24 hours 12/17/14 1539        Assessment: 69yo female on Vancomycin, doses held after trough returned this AM at 26.  Random Vanc this PM was 20.  Her last dose was at 1900 on 12/10.  Calculated pt-specific half-life is ~37hr.  Cr is 0.64 this AM, a bit up but within range of this hospitalization.    Abscess culture remains ntd  Goal of Therapy:  Vancomycin trough level 15-20 mcg/ml  Plan:  Change Vancomycin to 1250mg  IV q48, next dose at 0600 (level will be ~16) Watch renal fxn closely Repeat VT at steady state  Marisue HumbleKendra Kauri Garson, PharmD Clinical Pharmacist [facility]

## 2014-12-20 NOTE — Progress Notes (Signed)
Pt. bp 89/44, pt asleep but arousable. MD at bedside, orders to administer 1 Liter bolus. Retook pt's bp 159/63. Will continue to monitor. MD aware. Pt. Up in the chair resting.

## 2014-12-20 NOTE — Progress Notes (Signed)
ANTIBIOTIC CONSULT NOTE - FOLLOW UP  Pharmacy Consult for Vancomycin  Indication: Osteomyelitis  Labs:  Recent Labs  12/17/14 0650 12/18/14 0315 12/19/14 0600 12/20/14 0430  WBC 6.9 6.6 6.3  --   HGB 9.2* 8.3* 7.5*  --   PLT 117* 106* 98*  --   CREATININE 1.08* 0.67 0.58 0.64   Estimated Creatinine Clearance: 70.8 mL/min (by C-G formula based on Cr of 0.64).  Recent Labs  12/20/14 0430  VANCOTROUGH 26*     Anti-infectives    Start     Dose/Rate Route Frequency Ordered Stop   12/17/14 1700  vancomycin (VANCOCIN) IVPB 750 mg/150 ml premix     750 mg 150 mL/hr over 60 Minutes Intravenous Every 12 hours 12/17/14 1607     12/17/14 1600  cefTRIAXone (ROCEPHIN) 2 g in dextrose 5 % 50 mL IVPB     2 g 100 mL/hr over 30 Minutes Intravenous Every 24 hours 12/17/14 1539        Assessment: Elevated vancomycin trough, drawn correctly   Goal of Therapy:  Vancomycin trough level 15-20 mcg/ml  Plan:  -Check random vancomycin level in ~12 hours to assess clearance   Abran DukeLedford, Stanislaus Kaltenbach 12/20/2014,5:45 AM

## 2014-12-20 NOTE — Progress Notes (Signed)
Pt lactic acid 2.2, notified MD. Will continue to monitor.   Ok AnisSanders,Shavon Zenz A, RN 12/20/2014 5:00 PM

## 2014-12-21 ENCOUNTER — Ambulatory Visit (HOSPITAL_COMMUNITY): Payer: Medicare Other

## 2014-12-21 LAB — CBC
HEMATOCRIT: 27.2 % — AB (ref 36.0–46.0)
Hemoglobin: 8.3 g/dL — ABNORMAL LOW (ref 12.0–15.0)
MCH: 25.9 pg — ABNORMAL LOW (ref 26.0–34.0)
MCHC: 30.5 g/dL (ref 30.0–36.0)
MCV: 84.7 fL (ref 78.0–100.0)
PLATELETS: 123 10*3/uL — AB (ref 150–400)
RBC: 3.21 MIL/uL — AB (ref 3.87–5.11)
RDW: 18.9 % — AB (ref 11.5–15.5)
WBC: 5.7 10*3/uL (ref 4.0–10.5)

## 2014-12-21 LAB — CULTURE, ROUTINE-ABSCESS
Culture: NO GROWTH
Gram Stain: NONE SEEN

## 2014-12-21 LAB — BASIC METABOLIC PANEL
ANION GAP: 8 (ref 5–15)
BUN: 7 mg/dL (ref 6–20)
CALCIUM: 8.1 mg/dL — AB (ref 8.9–10.3)
CO2: 22 mmol/L (ref 22–32)
Chloride: 115 mmol/L — ABNORMAL HIGH (ref 101–111)
Creatinine, Ser: 0.78 mg/dL (ref 0.44–1.00)
Glucose, Bld: 86 mg/dL (ref 65–99)
POTASSIUM: 3.7 mmol/L (ref 3.5–5.1)
Sodium: 145 mmol/L (ref 135–145)

## 2014-12-21 LAB — GLUCOSE, CAPILLARY
Glucose-Capillary: 93 mg/dL (ref 65–99)
Glucose-Capillary: 108 mg/dL — ABNORMAL HIGH (ref 65–99)
Glucose-Capillary: 99 mg/dL (ref 65–99)
Glucose-Capillary: 88 mg/dL (ref 65–99)

## 2014-12-21 NOTE — Care Management Important Message (Signed)
Important Message  Patient Details  Name: Kellie Acosta MRN: [id number] Date of Birth: Oct 08, 1945   Medicare Important Message Given:  Yes    [name] [name] 12/21/2014, 3:27 PM

## 2014-12-21 NOTE — Progress Notes (Signed)
Subjective: patient complaining of back pain. She refused EEG.   Exam: Filed Vitals:   12/21/14 0555 12/21/14 0748  BP: 134/66 104/48  Pulse: 90 92  Temp: 98.8 F (37.1 C) 98.3 F (36.8 C)  Resp: 18 17        Gen: In bed, NAD MS: Alert to hospital and able to follow simple commands. Refusing to follow some commands due to her pain.  CN: PERRLA, EOMI, TML, Face symm Motor: moves all extremities antigravity but refuses much of exam due to pain.  Sensory: intact throughout   Pertinent Labs: None  Impression: Encephalopathy likely due to pain/medication withdrawal.  Patient refusing further testing.     Recommendations: No further recommendation.  Neurology S/O    12/21/2014, 10:41 AM Felicie Mornavid Sabriel Borromeo PA-C [facility] [phone]

## 2014-12-21 NOTE — Progress Notes (Signed)
Patient is refusing EEG, two techs attempted.

## 2014-12-21 NOTE — Progress Notes (Signed)
Speech Language Pathology Treatment: Dysphagia  Patient Details Name: Kellie Acosta Shock MRN: [id number] DOB: 20-Aug-1945 Today's Date: 12/21/2014 Time: [id number] SLP Time Calculation (min) (ACUTE ONLY): 14 min  Assessment / Plan / Recommendation Clinical Impression  ST follow up for therapeutic diet tolerance and possible diet advancement.  The patient's family were present and had brought in the patient's favorite beverage.  She was initially very hesitant to participate in meal observation but her family finally convinced her to participate.  The patient was responsive to what was said to her but she kept her eyes closed the entire time.  Meal observation was completed.  The patient's swallow trigger was timely and hyo-laryngeal excursion was adequate.  Movement of the puree and dual textured bolus was slow and the patient required cues to swallow all the presented material.  Given this the patient is at risk for aspiration.  Recommend continue with a dysphagia 1 diet with thin liquids.  ST will continue to follow up for clearing of mentation and possible diet advancement.     HPI HPI: Kellie Acosta Brummitt is a 69 y.o. female admitted for back pain s/p laminectomy  in June 2016. X-ray shows progression of osteomyelitis. PMH: hypertension, hyperlipidemia, polypharmacy, prior history of stroke, prior history of paroxysmal A. fib now with a pacemaker, CHF, anxiety, dementia.      SLP Plan  Continue with current plan of care     Recommendations  Diet recommendations: Dysphagia 1 (puree);Thin liquid Liquids provided via: Cup;Straw Medication Administration: Crushed with puree Supervision: Full supervision/cueing for compensatory strategies;Staff to assist with self feeding Compensations: Minimize environmental distractions;Slow rate;Small sips/bites Postural Changes and/or Swallow Maneuvers: Seated upright 90 degrees              Plan: Continue with current plan of care  Dimas AguasMelissa Hailie Searight, MA,  CCC-SLP Acute Rehab SLP [phone] ContrasGoodman, Azhar Knope N 12/21/2014, 12:08 PM

## 2014-12-21 NOTE — Progress Notes (Signed)
[facility] PROGRESS NOTE  Kellie Acosta ZOX:[id number] DOB: [birth year] DOA: 12/15/2014 PCP: No primary care provider on file. Brief history: Kellie Acosta is a 69 y.o. female has a past medical history significant for hypertension, hyperlipidemia, polypharmacy, prior history of stroke, prior history of paroxysmal A. fib now with a pacemaker, CHF, anxiety, dementia, is being direct admitted from [facility] emergency room with a chief complaint back pain. She underwent a back surgery in [place] by Dr Loralie Champagne in 06/2014 and was transferred to rehab and since then, she had been bed bound, had more falls and has been lethargic from the pain meds. The family reports she has no appetite and doesn't eat and does n't participate in therapy.  She also has been on and off antibiotics since then. She presented to [facility] with severe back and was transferred here for further evaluation.  On arrival to [facility], she was encephalopathic probably from the medications, confused and hypotensive. She responded briefly to narcan, but became very agitated and we had to give her benzodiazepines. CT lumbar spine ordered which showed progression of erosive destructive changes at the L4 L5 level consistent with persistent and worsened discitis osteomyelitis.  the IR and ID consulted for recommendations. She underwent  IR guided lumbar disc aspiration on 12/8 and antibiotics were started after the aspiration by ID.  Speech eval recommended dysphagia 1 diet with thin liquids.  12/10  Patient remains confused and CT head without contrast is unremarkable. EEG ordered.  12/11 hypotension, asymptomatic, one liter fluid bolus given and bp improved. Elevated lactic acid of 2.2.    Assessment/Plan:                                 Acute on chronic osteomyelitis: CT lumbar spine, showed progression and worsening of the osteomyelitis at L4 AND l5.  - IR consulted for  Lumbar disc aspiration, which was done on 12/8 and   Antibiotics started by ID after the aspiration.  - pain control and start physical therapy.  - awaiting the culture report from the disc aspiration, determine the duration of the antibiotics.     Acute encephalopathy:  Initially thought she was delirius from medication withdrawal.  Restarted methadone at a lower dose and continue with xanax.   Her confusion persists,. Her VIT b12 and TSH within normal limits. CT head without contrast on 12/9 does not show any acute stroke. MRI couldn't be done because of the pacemaker and neurology consulted for recommendations. EEG also ordered. Pt has refused the EEG and she has been refusing care , diet and other tests .  Discussed with the husband who is open to having a goals of care meeting and possible hospice if appropriate.   Acute renal failure:  IMPROVED with hydration. Monitor renal parameters.   Drop in hemoglobin / anemia of unclear etiology: - normocytic, get anemia panel, stool for occult blood ordered.  Anemia panel reveals normal iron and ferritin, vitamin b12 and folate levels.   COPD: No wheezing heard.  Resume nebs as needed.  Resume oxygen as needed.   Acute respiratory failure with hypoxia: probably from use of pain meds and sedatives.  Portable CXR ordered for follow up.  Wean her off the oxygen.  CXR shows low lung volumes and no acute findings.    Hypokalemia: replete as needed. Repeat in am.  Hypomagnesemia: Replete as needed.   Hypernatremia:  Probably from decreased po intake,  she refuses to eat, . Resolved with hydration.   Unstageable pressure injury left heel: Wound care consulted. silicone foam to protect and insulate for moist wound healing. No debridement at this time due to eschar stable and non fluctuant. Prevalon boot ordered by wound care.   Elevated lactic acid: Fluid boluses ordered and rpeat lactic acid in am.  UA and urine culture ordered.   Disposition: Failure to thrive and refusing care.   Palliative care consutled for goals of care and family open to hospice.   Code Status: FULL CODE.  Family Communication: none at bedside.   Disposition Plan: SNF when the infection improves    Consultants: IR ID NEUROLOGY WOUND CARE PHYSICAL THERAPY SPEECH EVALUATION Palliative care consult.   Procedures:  IR lumbar Disc aspiration on 12/8  CT head  EEG refused.   Antibiotics  Vancomycin12/8   Rocephin.   HPI/Subjective: Sleeping, comfortable.  Objective: Filed Vitals:   12/21/14 0555 12/21/14 0748  BP: 134/66 104/48  Pulse: 90 92  Temp: 98.8 F (37.1 C) 98.3 F (36.8 C)  Resp: 18 17    Intake/Output Summary (Last 24 hours) at 12/21/14 1333 Last data filed at 12/21/14 1000  Gross per 24 hour  Intake   2875 ml  Output      1 ml  Net   2874 ml   Filed Weights   12/17/14 2026 12/18/14 2015 12/19/14 2155  Weight: 85.3 kg (188 lb 0.8 oz) 85.6 kg (188 lb 11.4 oz) 84.7 kg (186 lb 11.7 oz)    Exam:   General:  Alert and confused.   Cardiovascular: s1s2  Respiratory: ctab  Abdomen: soft NT ND bs+  Musculoskeletal: trace pedal edema.   Data Reviewed: Basic Metabolic Panel:  Recent Labs Lab 12/17/14 0650 12/18/14 0315 12/18/14 1347 12/19/14 0600 12/19/14 1250 12/20/14 0430 12/21/14 0725  NA 145 145  --  146*  --  144 145  K 3.7 3.0*  --  2.9*  --  3.2* 3.7  CL 112* 114*  --  113*  --  112* 115*  CO2 25 26  --  25  --  26 22  GLUCOSE 83 120*  --  96  --  110* 86  BUN 31* 20  --  11  --  9 7  CREATININE 1.08* 0.67  --  0.58  --  0.64 0.78  CALCIUM 8.8* 8.8*  --  8.5*  --  8.4* 8.1*  MG  --   --  1.5*  --  1.7  --   --    Liver Function Tests:  Recent Labs Lab 12/15/14 1910 12/19/14 2120  AST 28 20  ALT 19 12*  ALKPHOS 76 65  BILITOT 0.5 0.6  PROT 5.8* 4.4*  ALBUMIN 2.5* 1.9*   No results for input(s): LIPASE, AMYLASE in the last 168 hours.  Recent Labs Lab 12/19/14 2120  AMMONIA 34   CBC:  Recent Labs Lab  12/15/14 1910  12/17/14 0650 12/18/14 0315 12/19/14 0600 12/20/14 0430 12/21/14 0725  WBC 13.5*  < > 6.9 6.6 6.3 6.4 5.7  NEUTROABS 10.6*  --   --   --   --   --   --   HGB 10.5*  < > 9.2* 8.3* 7.5* 7.9* 8.3*  HCT 35.5*  < > 30.3* 26.0* 23.5* 25.3* 27.2*  MCV 87.9  < > 84.9 82.8 82.2 83.8 84.7  PLT 206  < > 117* 106* 98* 102* 123*  < > = values  in this interval not displayed. Cardiac Enzymes: No results for input(s): CKTOTAL, CKMB, CKMBINDEX, TROPONINI in the last 168 hours. BNP (last 3 results) No results for input(s): BNP in the last 8760 hours.  ProBNP (last 3 results) No results for input(s): PROBNP in the last 8760 hours.  CBG:  Recent Labs Lab 12/20/14 1132 12/20/14 1700 12/20/14 2108 12/21/14 0734 12/21/14 1124  GLUCAP 160* 114* 94 93 108*    Recent Results (from the past 240 hour(s))  Fungus Culture with Smear     Status: None (Preliminary result)   Collection Time: 12/17/14  3:52 PM  Result Value Ref Range Status   Specimen Description ABSCESS  Final   Special Requests L4 L5 DISC ASPIRATION  Final   Fungal Smear   Final    NO YEAST OR FUNGAL ELEMENTS SEEN Performed at [facility]    Culture   Final    CULTURE IN PROGRESS FOR FOUR WEEKS Performed at [facility]    Report Status PENDING  Incomplete  Culture, routine-abscess     Status: None   Collection Time: 12/17/14  3:52 PM  Result Value Ref Range Status   Specimen Description ABSCESS  Final   Special Requests L4 L5 DISC ASPIRATION  Final   Gram Stain   Final    NO WBC SEEN NO SQUAMOUS EPITHELIAL CELLS SEEN NO ORGANISMS SEEN Performed at [facility]    Culture   Final    NO GROWTH 3 DAYS Performed at [facility]    Report Status 12/21/2014 FINAL  Final  Urine culture     Status: None (Preliminary result)   Collection Time: 12/20/14  5:21 PM  Result Value Ref Range Status   Specimen Description URINE, RANDOM  Final   Special Requests NONE  Final    Culture TOO YOUNG TO READ  Final   Report Status PENDING  Incomplete     Studies: No results found.  Scheduled Meds: . antiseptic oral rinse  7 mL Mouth Rinse BID  . aspirin EC  81 mg Oral Daily  . cefTRIAXone (ROCEPHIN)  IV  2 g Intravenous Q24H  . feeding supplement (ENSURE ENLIVE)  237 mL Oral TID BM  . fesoterodine  4 mg Oral Daily  . insulin aspart  0-9 Units Subcutaneous TID WC  . methadone  5 mg Oral BID  . polyethylene glycol  17 g Oral Daily  . pregabalin  150 mg Oral BID  . rosuvastatin  20 mg Oral Daily  . saccharomyces boulardii  250 mg Oral Daily  . senna-docusate  2 tablet Oral BID  . silver sulfADIAZINE  1 application Topical QPM  . sodium chloride  3 mL Intravenous Q12H  . tiotropium  18 mcg Inhalation Daily  . traZODone  100 mg Oral QHS  . vancomycin  1,250 mg Intravenous Q48H   Continuous Infusions: . sodium chloride 100 mL/hr at 12/20/14 1712    Active Problems:   Discitis   COPD (chronic obstructive pulmonary disease) (HCC)   HTN (hypertension)   CAD (coronary artery disease)   Pacemaker   Anxiety state   Arthritis   Diabetes (HCC)   Pressure ulcer   Acute osteomyelitis (HCC)    Time spent: 25 minutes    Eboni Coval  [facility] Pager [phone]. If 7PM-7AM, please contact night-coverage at [email or web address], password [facility] 12/21/2014, 1:33 PM  LOS: 6 days

## 2014-12-21 NOTE — Progress Notes (Signed)
INFECTIOUS DISEASE PROGRESS NOTE  ID: Kellie Acosta is a 69 y.o. female with  Active Problems:   Discitis   COPD (chronic obstructive pulmonary disease) (HCC)   HTN (hypertension)   CAD (coronary artery disease)   Pacemaker   Anxiety state   Arthritis   Diabetes (HCC)   Pressure ulcer   Acute osteomyelitis (HCC)  Subjective: Appears confused.   Abtx:  Anti-infectives    Start     Dose/Rate Route Frequency Ordered Stop   12/21/14 0600  vancomycin (VANCOCIN) 1,250 mg in sodium chloride 0.9 % 250 mL IVPB     1,250 mg 166.7 mL/hr over 90 Minutes Intravenous Every 48 hours 12/20/14 2044     12/17/14 1700  vancomycin (VANCOCIN) IVPB 750 mg/150 ml premix  Status:  Discontinued     750 mg 150 mL/hr over 60 Minutes Intravenous Every 12 hours 12/17/14 1607 12/20/14 0547   12/17/14 1600  cefTRIAXone (ROCEPHIN) 2 g in dextrose 5 % 50 mL IVPB     2 g 100 mL/hr over 30 Minutes Intravenous Every 24 hours 12/17/14 1539        Medications:  Scheduled: . antiseptic oral rinse  7 mL Mouth Rinse BID  . aspirin EC  81 mg Oral Daily  . cefTRIAXone (ROCEPHIN)  IV  2 g Intravenous Q24H  . feeding supplement (ENSURE ENLIVE)  237 mL Oral TID BM  . fesoterodine  4 mg Oral Daily  . insulin aspart  0-9 Units Subcutaneous TID WC  . methadone  5 mg Oral BID  . polyethylene glycol  17 g Oral Daily  . pregabalin  150 mg Oral BID  . rosuvastatin  20 mg Oral Daily  . saccharomyces boulardii  250 mg Oral Daily  . senna-docusate  2 tablet Oral BID  . silver sulfADIAZINE  1 application Topical QPM  . sodium chloride  3 mL Intravenous Q12H  . tiotropium  18 mcg Inhalation Daily  . traZODone  100 mg Oral QHS  . vancomycin  1,250 mg Intravenous Q48H    Objective: Vital signs in last 24 hours: Temp:  [97.9 F (36.6 C)-98.8 F (37.1 C)] 98.5 F (36.9 C) (12/12 1547) Pulse Rate:  [87-95] 95 (12/12 1547) Resp:  [17-18] 18 (12/12 1547) BP: (104-134)/(48-66) 115/62 mmHg (12/12 1547) SpO2:  [90  %-99 %] 99 % (12/12 1547)   General appearance: alert, no distress and pale Resp: clear to auscultation bilaterally Cardio: regular rate and rhythm GI: normal findings: bowel sounds normal and soft, non-tender  Lab Results  Recent Labs  12/20/14 0430 12/21/14 0725  WBC 6.4 5.7  HGB 7.9* 8.3*  HCT 25.3* 27.2*  NA 144 145  K 3.2* 3.7  CL 112* 115*  CO2 26 22  BUN 9 7  CREATININE 0.64 0.78   Liver Panel  Recent Labs  12/19/14 2120  PROT 4.4*  ALBUMIN 1.9*  AST 20  ALT 12*  ALKPHOS 65  BILITOT 0.6  BILIDIR 0.1  IBILI 0.5   Sedimentation Rate No results for input(s): ESRSEDRATE in the last 72 hours. C-Reactive Protein No results for input(s): CRP in the last 72 hours.  Microbiology: Recent Results (from the past 240 hour(s))  Fungus Culture with Smear     Status: None (Preliminary result)   Collection Time: 12/17/14  3:52 PM  Result Value Ref Range Status   Specimen Description ABSCESS  Final   Special Requests L4 L5 DISC ASPIRATION  Final   Fungal Smear   Final  NO YEAST OR FUNGAL ELEMENTS SEEN Performed at [facility]    Culture   Final    CULTURE IN PROGRESS FOR FOUR WEEKS Performed at [facility]    Report Status PENDING  Incomplete  Culture, routine-abscess     Status: None   Collection Time: 12/17/14  3:52 PM  Result Value Ref Range Status   Specimen Description ABSCESS  Final   Special Requests L4 L5 DISC ASPIRATION  Final   Gram Stain   Final    NO WBC SEEN NO SQUAMOUS EPITHELIAL CELLS SEEN NO ORGANISMS SEEN Performed at [facility]    Culture   Final    NO GROWTH 3 DAYS Performed at [facility]    Report Status 12/21/2014 FINAL  Final  Urine culture     Status: None (Preliminary result)   Collection Time: 12/20/14  5:21 PM  Result Value Ref Range Status   Specimen Description URINE, RANDOM  Final   Special Requests NONE  Final   Culture TOO YOUNG TO READ  Final   Report Status PENDING   Incomplete    Studies/Results: No results found.   Assessment/Plan: Osteomyelitis, Discitis Await aspirate Cx is negative.  Would plan for 6 weeks of vanco/ceftriaxone  Mental Status change Not clear if from medications or infection Not improving  ARF Cr now normal, resolved.   Total days of antibiotics: 4/42 vanco/ceftriaxone      Diagnosis: discitis  Culture Result: negative  Allergies  Allergen Reactions  . Codeine   . Meperidine And Related   . Penicillins Itching  . Pentazocine     Discharge antibiotics: vancomycin per pharmacy protocol, ceftriaxone 2g IVPB qday Duration: 42 days End Date: Jan 28, 2015  [facility] Care Per Protocol: Labs weekly while on IV antibiotics: x__ CBC with differential _x_ CMP _x_ CRP _x_ ESR _x_ Vancomycin trough  Fax weekly labs to [phone]  Clinic Follow Up Appt: Johnnye Sima, as needed.      Bobby Rumpf Infectious Diseases (pager) [phone] [email or web address] 12/21/2014, 4:24 PM  LOS: 6 days

## 2014-12-22 LAB — LACTIC ACID, PLASMA: LACTIC ACID, VENOUS: 0.8 mmol/L (ref 0.5–2.0)

## 2014-12-22 LAB — GLUCOSE, CAPILLARY
GLUCOSE-CAPILLARY: 88 mg/dL (ref 65–99)
GLUCOSE-CAPILLARY: 99 mg/dL (ref 65–99)
GLUCOSE-CAPILLARY: 123 mg/dL — AB (ref 65–99)
Glucose-Capillary: 72 mg/dL (ref 65–99)

## 2014-12-22 LAB — URINE CULTURE

## 2014-12-22 MED ORDER — OLANZAPINE 2.5 MG PO TABS
2.5000 mg | ORAL_TABLET | Freq: Every day | ORAL | Status: DC
  Administered 2014-12-22 – 2014-12-23 (×2): 2.5 mg via ORAL
  Filled 2014-12-22 (×5): qty 1

## 2014-12-22 NOTE — Progress Notes (Signed)
[facility] PROGRESS NOTE  Kellie Acosta ZOX:[id number] DOB: [birth year] DOA: 12/15/2014 PCP: No primary care provider on file. Brief history: Kellie Acosta is a 69 y.o. female has a past medical history significant for hypertension, hyperlipidemia, polypharmacy, prior history of stroke, prior history of paroxysmal A. fib now with a pacemaker, CHF, anxiety, dementia, is being direct admitted from [facility] emergency room with a chief complaint back pain. She underwent a back surgery in [place] by Dr Loralie Champagne in 06/2014 and was transferred to rehab and since then, she had been bed bound, had more falls and has been lethargic from the pain meds. The family reports she has no appetite and doesn't eat and does n't participate in therapy.  She also has been on and off antibiotics since then. She presented to [facility] with severe back and was transferred here for further evaluation.  On arrival to [facility], she was encephalopathic probably from the medications, confused and hypotensive. She responded briefly to narcan, but became very agitated and we had to give her benzodiazepines. CT lumbar spine ordered which showed progression of erosive destructive changes at the L4 L5 level consistent with persistent and worsened discitis osteomyelitis.  the IR and ID consulted for recommendations. She underwent  IR guided lumbar disc aspiration on 12/8 and antibiotics were started after the aspiration by ID.  Speech eval recommended dysphagia 1 diet with thin liquids.  12/10  Patient remains confused and CT head without contrast is unremarkable. EEG ordered.  12/11 hypotension, asymptomatic, one liter fluid bolus given and bp improved. Elevated lactic acid of 2.2.   12/12 pt refused EEG and refusing to eat, and care. Discussed with family and consulted palliative care for goals of care and hospice.   12/13 lactic acid is normal today, she is more awake and alert and requesting food. Palliative care consult  pending.   Assessment/Plan:                                 Acute on chronic osteomyelitis: CT lumbar spine, showed progression and worsening of the osteomyelitis at L4 AND l5.  - IR consulted for  Lumbar disc aspiration, which was done on 12/8 and  Antibiotics started by ID after the aspiration.  - pain control and start physical therapy.  - negative cultures report from the disc aspiration, plan for 42 days of antibiotics. She completed 5 days of IV antibiotics.  - after palliative care consult, if family wants to continue antibiotics for the duration, she will need a PICC line prior to discharge for prolonged IV antibiotics.     Acute encephalopathy:  Initially thought she was delirius from medication withdrawal.  Restarted methadone at a lower dose and continue with xanax. She was on TID methadone 5 mg , transitioned to 5 mg bid, and in a week's time, can wean her to daily methadone.   Her confusion persists,. Her VIT b12 and TSH within normal limits. CT head without contrast on 12/9 does not show any acute stroke. MRI couldn't be done because of the pacemaker and neurology consulted for recommendations. EEG also ordered. Pt has refused the EEG and she has been refusing care , diet and other tests .  Discussed with the husband who is open to having a goals of care meeting and possible hospice if appropriate.   Acute renal failure:  IMPROVED with hydration. Monitor renal parameters.   Drop in hemoglobin / anemia of  unclear etiology: - normocytic, get anemia panel, stool for occult blood ordered.  Anemia panel reveals normal iron and ferritin, vitamin b12 and folate levels.   COPD: No wheezing heard.  Resume nebs as needed.  Resume oxygen as needed.   Acute respiratory failure with hypoxia: probably from use of pain meds and sedatives.  Portable CXR ordered for follow up.  Wean her off the oxygen.  CXR shows low lung volumes and no acute findings.    Hypokalemia: replete as  needed.   Hypomagnesemia: Replete as needed.   Hypernatremia:  Probably from decreased po intake, she refuses to eat, . Resolved with hydration.   Unstageable pressure injury left heel: Wound care consulted. silicone foam to protect and insulate for moist wound healing. No debridement at this time due to eschar stable and non fluctuant. Prevalon boot ordered by wound care.   Elevated lactic acid: Fluid boluses ordered and repeat lactic acid is normal.  UA and urine culture ordered.   Disposition: Failure to thrive and refusing care.  Palliative care consulted for goals of care and family open to hospice.   Code Status: FULL CODE.  Family Communication: none at bedside.   Disposition Plan: SNF after palliative care consult.   Consultants: IR ID NEUROLOGY WOUND CARE PHYSICAL THERAPY SPEECH EVALUATION Palliative care consult.   Procedures:  IR lumbar Disc aspiration on 12/8  CT head  EEG refused.   Antibiotics  Vancomycin12/8   Rocephin.   HPI/Subjective: Sleeping, comfortable. Requesting to eat.  Objective: Filed Vitals:   12/22/14 0424 12/22/14 0855  BP: 101/48 120/48  Pulse: 78 77  Temp: 97.8 F (36.6 C) 97.9 F (36.6 C)  Resp: 18 17    Intake/Output Summary (Last 24 hours) at 12/22/14 1153 Last data filed at 12/22/14 0855  Gross per 24 hour  Intake    200 ml  Output    500 ml  Net   -300 ml   Filed Weights   12/18/14 2015 12/19/14 2155 12/21/14 2137  Weight: 85.6 kg (188 lb 11.4 oz) 84.7 kg (186 lb 11.7 oz) 85.1 kg (187 lb 9.8 oz)    Exam:   General:  Alert and confused.   Cardiovascular: s1s2  Respiratory: ctab  Abdomen: soft NT ND bs+  Musculoskeletal: trace pedal edema.   Data Reviewed: Basic Metabolic Panel:  Recent Labs Lab 12/17/14 0650 12/18/14 0315 12/18/14 1347 12/19/14 0600 12/19/14 1250 12/20/14 0430 12/21/14 0725  NA 145 145  --  146*  --  144 145  K 3.7 3.0*  --  2.9*  --  3.2* 3.7  CL 112* 114*  --  113*   --  112* 115*  CO2 25 26  --  25  --  26 22  GLUCOSE 83 120*  --  96  --  110* 86  BUN 31* 20  --  11  --  9 7  CREATININE 1.08* 0.67  --  0.58  --  0.64 0.78  CALCIUM 8.8* 8.8*  --  8.5*  --  8.4* 8.1*  MG  --   --  1.5*  --  1.7  --   --    Liver Function Tests:  Recent Labs Lab 12/15/14 1910 12/19/14 2120  AST 28 20  ALT 19 12*  ALKPHOS 76 65  BILITOT 0.5 0.6  PROT 5.8* 4.4*  ALBUMIN 2.5* 1.9*   No results for input(s): LIPASE, AMYLASE in the last 168 hours.  Recent Labs Lab 12/19/14 2120  AMMONIA  34   CBC:  Recent Labs Lab 12/15/14 1910  12/17/14 0650 12/18/14 0315 12/19/14 0600 12/20/14 0430 12/21/14 0725  WBC 13.5*  < > 6.9 6.6 6.3 6.4 5.7  NEUTROABS 10.6*  --   --   --   --   --   --   HGB 10.5*  < > 9.2* 8.3* 7.5* 7.9* 8.3*  HCT 35.5*  < > 30.3* 26.0* 23.5* 25.3* 27.2*  MCV 87.9  < > 84.9 82.8 82.2 83.8 84.7  PLT 206  < > 117* 106* 98* 102* 123*  < > = values in this interval not displayed. Cardiac Enzymes: No results for input(s): CKTOTAL, CKMB, CKMBINDEX, TROPONINI in the last 168 hours. BNP (last 3 results) No results for input(s): BNP in the last 8760 hours.  ProBNP (last 3 results) No results for input(s): PROBNP in the last 8760 hours.  CBG:  Recent Labs Lab 12/21/14 0734 12/21/14 1124 12/21/14 1623 12/21/14 2136 12/22/14 0737  GLUCAP 93 108* 99 88 88    Recent Results (from the past 240 hour(s))  Fungus Culture with Smear     Status: None (Preliminary result)   Collection Time: 12/17/14  3:52 PM  Result Value Ref Range Status   Specimen Description ABSCESS  Final   Special Requests L4 L5 DISC ASPIRATION  Final   Fungal Smear   Final    NO YEAST OR FUNGAL ELEMENTS SEEN Performed at [facility]    Culture   Final    CULTURE IN PROGRESS FOR FOUR WEEKS Performed at [facility]    Report Status PENDING  Incomplete  Culture, routine-abscess     Status: None   Collection Time: 12/17/14  3:52 PM  Result Value  Ref Range Status   Specimen Description ABSCESS  Final   Special Requests L4 L5 DISC ASPIRATION  Final   Gram Stain   Final    NO WBC SEEN NO SQUAMOUS EPITHELIAL CELLS SEEN NO ORGANISMS SEEN Performed at [facility]    Culture   Final    NO GROWTH 3 DAYS Performed at [facility]    Report Status 12/21/2014 FINAL  Final  Urine culture     Status: None (Preliminary result)   Collection Time: 12/20/14  5:21 PM  Result Value Ref Range Status   Specimen Description URINE, RANDOM  Final   Special Requests NONE  Final   Culture TOO YOUNG TO READ  Final   Report Status PENDING  Incomplete     Studies: No results found.  Scheduled Meds: . antiseptic oral rinse  7 mL Mouth Rinse BID  . aspirin EC  81 mg Oral Daily  . cefTRIAXone (ROCEPHIN)  IV  2 g Intravenous Q24H  . feeding supplement (ENSURE ENLIVE)  237 mL Oral TID BM  . fesoterodine  4 mg Oral Daily  . insulin aspart  0-9 Units Subcutaneous TID WC  . methadone  5 mg Oral BID  . polyethylene glycol  17 g Oral Daily  . pregabalin  150 mg Oral BID  . rosuvastatin  20 mg Oral Daily  . saccharomyces boulardii  250 mg Oral Daily  . senna-docusate  2 tablet Oral BID  . silver sulfADIAZINE  1 application Topical QPM  . sodium chloride  3 mL Intravenous Q12H  . tiotropium  18 mcg Inhalation Daily  . traZODone  100 mg Oral QHS  . vancomycin  1,250 mg Intravenous Q48H   Continuous Infusions: . sodium chloride 50  mL/hr (12/22/14 1131)    Active Problems:   Discitis   COPD (chronic obstructive pulmonary disease) (HCC)   HTN (hypertension)   CAD (coronary artery disease)   Pacemaker   Anxiety state   Arthritis   Diabetes (HCC)   Pressure ulcer   Acute osteomyelitis (HCC)    Time spent: 25 minutes    Linzy Laury  [facility] Pager [phone]. If 7PM-7AM, please contact night-coverage at [email or web address], password [facility] 12/22/2014, 11:53 AM  LOS: 7 days

## 2014-12-22 NOTE — Clinical Social Work Note (Signed)
CSW attempted to contact patient's husband today regarding discharge disposition and messages left at [phone] (cell) and [phone] (work). Per attending MD, patient may be ready for discharge on Wednesday.  Genelle BalVanessa Zeddie Njie, MSW, LCSW Licensed Clinical Social Worker Clinical Social Work Department [facility] [phone]

## 2014-12-22 NOTE — Progress Notes (Signed)
Speech Language Pathology Treatment: Dysphagia  Patient Details Name: Kellie Acosta MRN: [id number] DOB: 02/08/1945 Today's Date: 12/22/2014 Time: [id number] SLP Time Calculation (min) (ACUTE ONLY): 12 min  Assessment / Plan / Recommendation Clinical Impression  ST follow up for therapeutic diet tolerance and possible diet advancement.  The patient was more alert and her eyes were open when this therapist entered the room.  She tolerated the bed being moved into a partially reclined position.  She resisted her bed being totally raised due to back pain.  While the patient was more awake she was still very confused.  Chart review indicated that the patient has been afebrile, lungs are clear but diminished and she has not eaten much.  The only PO's she was accepting of today was the drink her family had brought in for her yesterday.  She refused all other PO's including her entire lunch tray and dry solids.  At this time, ST is unable to recommend advancing her diet since we have been unable to observe her with soft or regular solids.  Recommend continue with current dysphagia 1 diet and thin liquids.  ST will continue to follow.     HPI HPI: Kellie Acosta is a 69 y.o. female admitted for back pain s/p laminectomy  in June 2016. X-ray shows progression of osteomyelitis. PMH: hypertension, hyperlipidemia, polypharmacy, prior history of stroke, prior history of paroxysmal A. fib now with a pacemaker, CHF, anxiety, dementia.      SLP Plan  Continue with current plan of care     Recommendations  Diet recommendations: Dysphagia 1 (puree);Thin liquid Liquids provided via: Cup;Straw Medication Administration: Crushed with puree Supervision: Full supervision/cueing for compensatory strategies;Staff to assist with self feeding Compensations: Minimize environmental distractions;Slow rate;Small sips/bites Postural Changes and/or Swallow Maneuvers: Seated upright 90 degrees              Plan: Continue  with current plan of care  Kellie AguasMelissa Ashland Osmer, MA, CCC-SLP Acute Rehab SLP [phone] Kellie Acosta, Kellie Acosta 12/22/2014, 1:18 PM

## 2014-12-22 NOTE — Progress Notes (Signed)
Physical Therapy Treatment Patient Details Name: Kellie Acosta MRN: [id number] DOB: [birth year] Today's Date: 12/22/2014    History of Present Illness Kellie Acosta is a 69 y.o. female has a past medical history significant for hypertension, hyperlipidemia, polypharmacy, prior history of stroke, prior history of paroxysmal A. fib now with a pacemaker, CHF, anxiety, and dementia. S/p IR guided lumbar disc aspiration on 12/8 for Acute on chronic osteomyelitis of L4 and L5.    PT Comments    With max encouragement pt did participate in PT this date and was able to try standing. Pt con't to have impaired cognition due to dementia, back pain and R hip pain greatly limiting OOB mobility tolerance. Pt remains appropriate for return to SNF upon d/c.  Follow Up Recommendations  SNF;Supervision/Assistance - 24 hour     Equipment Recommendations  None recommended by PT    Recommendations for Other Services       Precautions / Restrictions Precautions Precautions: Back;Other (comment) (for comfort) Precaution Comments: Back precautions for comfort. Not specifically ordered Restrictions Weight Bearing Restrictions: No    Mobility  Bed Mobility Overal bed mobility: Needs Assistance Bed Mobility: Rolling;Sidelying to Sit;Sit to Sidelying Rolling: Max assist Sidelying to sit: Mod assist;+2 for physical assistance     Sit to sidelying: Mod assist;+2 for physical assistance General bed mobility comments: used bed pad for pain management to ensure log roll. Pt did initiate use of UEs however unable to roll or push up with UEs  Transfers Overall transfer level: Needs assistance Equipment used:  (2 person lift with bed pad and gait belt) Transfers: Sit to/from Stand Sit to Stand: +2 physical assistance;Mod assist         General transfer comment: max directional v/c's and definite use of bed pad to assist with achieving upright position. Pt completed 2 stands <10 seconds each.to scoot to  [facility]  Ambulation/Gait             General Gait Details: unable to tolerate at this time due to R hip and back pain   Stairs            Wheelchair Mobility    Modified Rankin (Stroke Patients Only)       Balance Overall balance assessment: Needs assistance Sitting-balance support: Feet supported;Single extremity supported Sitting balance-Leahy Scale: Poor Sitting balance - Comments: Sat EOB x5 minutes Progressing to single UE support and intermittent min assist for trunk control. Pt able to scoot posteriorly but minimally. Leans posterior and Lt. Postural control: Posterior lean (initially due to pain) Standing balance support: Bilateral upper extremity supported Standing balance-Leahy Scale: Poor Standing balance comment: needs external assist                    Cognition Arousal/Alertness: Awake/alert Behavior During Therapy: Anxious Overall Cognitive Status: No family/caregiver present to determine baseline cognitive functioning Area of Impairment: Safety/judgement;Awareness;Problem solving;Attention Orientation Level: Disoriented to;Place;Time;Situation Current Attention Level: Sustained     Safety/Judgement: Decreased awareness of safety;Decreased awareness of deficits Awareness: Emergent Problem Solving: Slow processing;Requires verbal cues;Requires tactile cues;Difficulty sequencing General Comments: pt perseverating on water and having to pee however pt with urinary catheter    Exercises      General Comments General comments (skin integrity, edema, etc.): pt very anxious once EOB due to pain      Pertinent Vitals/Pain Pain Assessment: Faces Faces Pain Scale: Hurts little more Pain Location: back during movement, initially EOB but then once re-positioned with feet on floor pt able to  tolerate sitting with minimal pain Pain Intervention(s): Monitored during session    Home Living                      Prior Function             PT Goals (current goals can now be found in the care plan section) Acute Rehab PT Goals Patient Stated Goal: none state Progress towards PT goals: Progressing toward goals    Frequency  Min 2X/week    PT Plan Current plan remains appropriate    Co-evaluation             End of Session Equipment Utilized During Treatment: Oxygen Activity Tolerance: Patient limited by pain;Treatment limited secondary to agitation Patient left: in bed;with call bell/phone within reach;with bed alarm set     Time: [id number] PT Time Calculation (min) (ACUTE ONLY): 16 min  Charges:  $Therapeutic Activity: 8-22 mins                    G Codes:      Marcene BrawnChadwell, [name] 12/22/2014, 12:55 PM   Lewis ShockAshly Mykalah Saari, PT, DPT Pager #: [phone] Office #: [phone]

## 2014-12-23 DIAGNOSIS — G9340 Encephalopathy, unspecified: Secondary | ICD-10-CM | POA: Insufficient documentation

## 2014-12-23 DIAGNOSIS — M545 Low back pain, unspecified: Secondary | ICD-10-CM | POA: Insufficient documentation

## 2014-12-23 DIAGNOSIS — L8990 Pressure ulcer of unspecified site, unspecified stage: Secondary | ICD-10-CM

## 2014-12-23 DIAGNOSIS — Z515 Encounter for palliative care: Secondary | ICD-10-CM

## 2014-12-23 DIAGNOSIS — M8610 Other acute osteomyelitis, unspecified site: Secondary | ICD-10-CM

## 2014-12-23 LAB — BASIC METABOLIC PANEL
Anion gap: 7 (ref 5–15)
BUN: 8 mg/dL (ref 6–20)
CALCIUM: 8.7 mg/dL — AB (ref 8.9–10.3)
CO2: 23 mmol/L (ref 22–32)
CREATININE: 1.04 mg/dL — AB (ref 0.44–1.00)
Chloride: 112 mmol/L — ABNORMAL HIGH (ref 101–111)
GFR calc Af Amer: >60 mL/min (ref >60)
GFR, EST NON AFRICAN AMERICAN: 53 mL/min — AB (ref >60)
GLUCOSE: 110 mg/dL — AB (ref 65–99)
Potassium: 3.8 mmol/L (ref 3.5–5.1)
Sodium: 142 mmol/L (ref 135–145)

## 2014-12-23 LAB — GLUCOSE, CAPILLARY
GLUCOSE-CAPILLARY: 68 mg/dL (ref 65–99)
GLUCOSE-CAPILLARY: 94 mg/dL (ref 65–99)
GLUCOSE-CAPILLARY: 109 mg/dL — AB (ref 65–99)
Glucose-Capillary: 100 mg/dL — ABNORMAL HIGH (ref 65–99)
Glucose-Capillary: 112 mg/dL — ABNORMAL HIGH (ref 65–99)

## 2014-12-23 MED ORDER — FENTANYL CITRATE (PF) 100 MCG/2ML IJ SOLN
25.0000 ug | INTRAMUSCULAR | Status: DC | PRN
  Administered 2014-12-23 – 2014-12-24 (×6): 25 ug via INTRAVENOUS
  Filled 2014-12-23 (×6): qty 2

## 2014-12-23 MED ORDER — DEXTROSE-NACL 5-0.9 % IV SOLN: INTRAVENOUS | Status: DC

## 2014-12-23 MED ORDER — SODIUM CHLORIDE 0.9 % IV SOLN
100.0000 mg | INTRAVENOUS | Status: DC
  Administered 2014-12-24: 100 mg via INTRAVENOUS
  Filled 2014-12-23: qty 100

## 2014-12-23 MED ORDER — ANIDULAFUNGIN 100 MG IV SOLR
200.0000 mg | Freq: Once | INTRAVENOUS | Status: AC
  Administered 2014-12-23: 200 mg via INTRAVENOUS
  Filled 2014-12-23: qty 200

## 2014-12-23 MED ORDER — SODIUM CHLORIDE 0.9 % IJ SOLN
10.0000 mL | INTRAMUSCULAR | Status: DC | PRN
  Administered 2014-12-24: 10 mL
  Filled 2014-12-23: qty 40

## 2014-12-23 MED ORDER — DEXTROSE 50 % IV SOLN
INTRAVENOUS | Status: AC
  Administered 2014-12-23: 50 mL
  Filled 2014-12-23: qty 50

## 2014-12-23 MED ORDER — DEXTROSE 50 % IV SOLN: 25.0000 mL | Freq: Once | INTRAVENOUS | Status: DC

## 2014-12-23 NOTE — Consult Note (Signed)
Consultation Note Date: 12/23/2014   Patient Name: Kellie Acosta  DOB: 08-Oct-1945  MRN: [id number]  Age / Sex: 69 y.o., female  PCP: No primary care provider on file. Referring Physician: Eugenie Filler, MD  Reason for Consultation: Establishing goals of care    Clinical Assessment/Narrative:    Kellie Acosta is a 69 y.o. female has a past medical history significant for hypertension, hyperlipidemia, polypharmacy, prior history of stroke, prior history of paroxysmal A. fib now with a pacemaker, CHF, anxiety, dementia, is being direct admitted from [facility] emergency room with a chief complaint back pain. She underwent a back surgery in [place] by Dr Donivan Scull in 06/2014 and was transferred to rehab and since then, she had been bed bound, had more falls and has been lethargic from the pain meds. The family reports she has no appetite and doesn't eat and does n't participate in therapy.  She also has been on and off antibiotics since then. She presented to [facility] with severe back and was transferred here for further evaluation.  On arrival to [facility], she was encephalopathic probably from the medications, confused and hypotensive. She responded briefly to narcan, but became very agitated and we had to give her benzodiazepines. CT lumbar spine ordered which showed progression of erosive destructive changes at the L4 L5 level consistent with persistent and worsened discitis osteomyelitis.  the IR and ID consulted for recommendations. She underwent  IR guided lumbar disc aspiration on 12/8 and antibiotics were started after the aspiration by ID.   Speech eval recommended dysphagia 1 diet with thin liquids.   12/10  Patient remains confused and CT head without contrast is unremarkable. EEG ordered.   12/11 hypotension, asymptomatic, one liter fluid bolus given and bp improved. Elevated lactic acid of 2.2.    12/12 pt  refused EEG and refusing to eat, and care. Discussed with family and consulted palliative care for goals of care and hospice.   12/13 lactic acid is normal today, she is more awake and alert and requesting food. Palliative care consult pending.   Patient is awake but not alert. She is in pain, she is restless. Met with husband by the bedside. Patient has been married for 17 years. She has 2 sons who are grown and out of the house. Patient's husband states that she has lost the will to live since her back surgery. Patient declines to eat, she has been declining PT, for several months now.   Discussed with patient's husband about her condition, introduced palliative medicine. Code status now established as DNR DNI. Discussed pain management. Discussed appropriate disposition options. Patient to go to SNF in [place] on discharge. Discussed about having palliative care follow up at SNF on D/C  Discussed with Dr Grandville Silos. Thank you for the consult.   Contacts/Participants in Discussion: Primary Decision Maker: husband.  Relationship to Patient: husband. Patient has 2 sons from her previous relationship. Married to her husband for past 17 years.  HCPOA: yes   husband of 17 years.   SUMMARY OF RECOMMENDATIONS: 1. Fentanyl IV PRN for pain, patient on PO methadone, she spit out her PO Methadone. Patient with codeine allergy.  2. Code status now clarified as DNR DNI.  3. SNF for IV antibiotics upon discharge. Recommend palliative consult at SNF through [place]. Discussed with husband who is in agreement with this plan.   Code Status/Advance Care Planning: DNR    Code Status Orders  Start     Ordered   12/23/14 1101  Do not attempt resuscitation (DNR)   Continuous    Question Answer Comment  In the event of cardiac or respiratory ARREST Do not call a "code blue"   In the event of cardiac or respiratory ARREST Do not perform Intubation, CPR, defibrillation or ACLS     In the event of cardiac or respiratory ARREST Use medication by any route, position, wound care, and other measures to relive pain and suffering. May use oxygen, suction and manual treatment of airway obstruction as needed for comfort.      12/23/14 1100      Other Directives:Other  Symptom Management:    fentanyl iv prn for pain  On PO methadone. Patient's husband states she was not on PO methadone prior to her back surgery, husband believes methadone was started at [facility].   Palliative Prophylaxis:   Bowel Regimen  Additional Recommendations (Limitations, Scope, Preferences):   SNF soon   palliative consult at SNF  Psycho-social/Spiritual:  Support System: [place] Desire for further Chaplaincy support:no Additional Recommendations: Education on Hospice  Prognosis: < 12 months  Discharge Planning: [place] for rehab with Palliative care service follow-up   Chief Complaint/ Primary Diagnoses: Present on Admission:  . Discitis . COPD (chronic obstructive pulmonary disease) ([place]) . HTN (hypertension) . CAD (coronary artery disease) . Pacemaker . Anxiety state . Arthritis  I have reviewed the medical record, interviewed the patient and family, and examined the patient. The following aspects are pertinent.  Past Medical History  Diagnosis Date  . PAF (paroxysmal atrial fibrillation) ([place])     pacemaker  . HTN (hypertension)   . Syncope   . Polypharmacy   . Myocardial infarction ([place])   . Presence of permanent cardiac pacemaker   . Stroke ([place])   . CHF (congestive heart failure) ([place])   . Shortness of breath dyspnea   . Diabetes mellitus without complication ([place])   . Depression   . Dementia   . Anxiety   . GERD (gastroesophageal reflux disease)   . Arthritis   . Fibromyalgia    Social History   Social History  . Marital Status: Divorced    Spouse Name: N/A  . Number of Children: 3  . Years of Education: N/A   Occupational History  .  retired    Social History Main Topics  . Smoking status: Never Smoker   . Smokeless tobacco: None  . Alcohol Use: No  . Drug Use: No  . Sexual Activity: No   Other Topics Concern  . None   Social History Narrative   PPM   Certified letter 1/60 djw    Family History  Problem Relation Age of Onset  . Heart disease    . Cancer    . Heart disease Mother   . Heart disease Father   . Heart disease Sister   . Heart disease Brother    Scheduled Meds: . [START ON 12/24/2014] anidulafungin  100 mg Intravenous Q24H  . anidulafungin  200 mg Intravenous Once  . antiseptic oral rinse  7 mL Mouth Rinse BID  . aspirin EC  81 mg Oral Daily  . cefTRIAXone (ROCEPHIN)  IV  2 g Intravenous Q24H  . dextrose  25 mL Intravenous Once  . feeding supplement (ENSURE ENLIVE)  237 mL Oral TID BM  . fesoterodine  4 mg Oral Daily  . insulin aspart  0-9 Units Subcutaneous TID WC  . methadone  5 mg Oral BID  . OLANZapine  2.5 mg Oral QHS  . polyethylene glycol  17 g Oral Daily  . pregabalin  150 mg Oral BID  . rosuvastatin  20 mg Oral Daily  . saccharomyces boulardii  250 mg Oral Daily  . senna-docusate  2 tablet Oral BID  . silver sulfADIAZINE  1 application Topical QPM  . sodium chloride  3 mL Intravenous Q12H  . tiotropium  18 mcg Inhalation Daily  . traZODone  100 mg Oral QHS  . vancomycin  1,250 mg Intravenous Q48H   Continuous Infusions: . dextrose 5 % and 0.9% NaCl     PRN Meds:.ALPRAZolam, alum & mag hydroxide-simeth, fentaNYL (SUBLIMAZE) injection, ondansetron **OR** ondansetron (ZOFRAN) IV Medications Prior to Admission:  Prior to Admission medications   Medication Sig Start Date End Date Taking? Authorizing Provider  aspirin EC 81 MG tablet Take 81 mg by mouth daily.   Yes Historical Provider, MD  furosemide (LASIX) 40 MG tablet Take 40 mg by mouth daily.   Yes Historical Provider, MD  Lactobacillus (FLORAJEN ACIDOPHILUS) CAPS Take 1 capsule by mouth daily.   Yes Historical  Provider, MD  Melatonin 3 MG TABS Take 6 mg by mouth at bedtime.   Yes Historical Provider, MD  methadone (DOLOPHINE) 5 MG tablet Take 5 mg by mouth 3 (three) times daily.   Yes Historical Provider, MD  pregabalin (LYRICA) 150 MG capsule Take 150 mg by mouth 2 (two) times daily.   Yes Historical Provider, MD  silver sulfADIAZINE (SILVADENE) 1 % cream Apply 1 application topically every evening. Outer left heel   Yes Historical Provider, MD  tolterodine (DETROL) 2 MG tablet Take 4 mg by mouth daily.   Yes Historical Provider, MD  traZODone (DESYREL) 100 MG tablet Take 100 mg by mouth at bedtime.   Yes Historical Provider, MD  ALPRAZolam Duanne Moron) 1 MG tablet Take 0.5 mg by mouth every 8 (eight) hours as needed for anxiety.     Historical Provider, MD  traMADol (ULTRAM) 50 MG tablet Take 50 mg by mouth every 6 (six) hours as needed.      Historical Provider, MD   Allergies  Allergen Reactions  . Codeine   . Meperidine And Related   . Penicillins Itching  . Pentazocine     Review of Systems Patient uncomfortable, in pain. Does not respond well to questions asked.  Physical Exam Weak uncomfortable Chronic bed bounds present on admission S1 S2 Clear anteriorly Edema  Vital Signs: BP 98/44 mmHg  Pulse 90  Temp(Src) 98.4 F (36.9 C) (Oral)  Resp 16  Ht 5' 6"  (1.676 m)  Wt 85.1 kg (187 lb 9.8 oz)  BMI 30.30 kg/m2  SpO2 95%  LMP  (LMP Unknown)  SpO2: SpO2: 95 % O2 Device:SpO2: 95 % O2 Flow Rate: .O2 Flow Rate (L/min): 2 L/min  IO: Intake/output summary:  Intake/Output Summary (Last 24 hours) at 12/23/14 1137 Last data filed at 12/23/14 0959  Gross per 24 hour  Intake    558 ml  Output    500 ml  Net     58 ml    LBM: Last BM Date: 12/21/14 Baseline Weight: Weight: 84.5 kg (186 lb 4.6 oz) Most recent weight: Weight: 85.1 kg (187 lb 9.8 oz)      Palliative Assessment/Data:  Flowsheet Rows        Most Recent Value   Intake Tab    Referral Department  Hospitalist   Unit  at Time of  Referral  Med/Surg Unit   Palliative Care Primary Diagnosis  Nephrology   Date Notified  12/21/14   Palliative Care Type  New Palliative care   Reason for referral  Pain, Clarify Goals of Care, Counsel Regarding Hospice   Date of Admission  12/15/14   Date first seen by Palliative Care  12/23/14   # of days IP prior to Palliative referral  6   Clinical Assessment    Palliative Performance Scale Score  30%   Pain Max last 24 hours  10   Pain Min Last 24 hours  9   Psychosocial & Spiritual Assessment    Social Work Plan of Care  Counseling   Palliative Care Outcomes    Patient/Family meeting held?  Yes   Who was at the meeting?  husband   Palliative Care Outcomes  Clarified goals of care   Palliative Care follow-up planned  Yes, Facility      Additional Data Reviewed:  CBC:    Component Value Date/Time   WBC 5.7 12/21/2014 0725   HGB 8.3* 12/21/2014 0725   HCT 27.2* 12/21/2014 0725   PLT 123* 12/21/2014 0725   MCV 84.7 12/21/2014 0725   NEUTROABS 10.6* 12/15/2014 1910   LYMPHSABS 1.6 12/15/2014 1910   MONOABS 0.6 12/15/2014 1910   EOSABS 0.7 12/15/2014 1910   BASOSABS 0.0 12/15/2014 1910   Comprehensive Metabolic Panel:    Component Value Date/Time   NA 145 12/21/2014 0725   K 3.7 12/21/2014 0725   CL 115* 12/21/2014 0725   CO2 22 12/21/2014 0725   BUN 7 12/21/2014 0725   CREATININE 0.78 12/21/2014 0725   GLUCOSE 86 12/21/2014 0725   CALCIUM 8.1* 12/21/2014 0725   AST 20 12/19/2014 2120   ALT 12* 12/19/2014 2120   ALKPHOS 65 12/19/2014 2120   BILITOT 0.6 12/19/2014 2120   PROT 4.4* 12/19/2014 2120   ALBUMIN 1.9* 12/19/2014 2120     Time In: 1030 Time Out: 1130 Time Total: 60 min  Greater than 50%  of this time was spent counseling and coordinating care related to the above assessment and plan.  Signed by: Loistine Chance, MD [place], MD  12/23/2014, 11:37 AM  Please contact Palliative Medicine Team phone at [phone] for questions  and concerns.

## 2014-12-23 NOTE — Progress Notes (Signed)
[facility] PROGRESS NOTE  Andria Head GUY:[id number] DOB: 06/02/1945 DOA: 12/15/2014 PCP: No primary care provider on file.  Brief history: Kellie Acosta is a 69 y.o. female has a past medical history significant for hypertension, hyperlipidemia, polypharmacy, prior history of stroke, prior history of paroxysmal A. fib now with a pacemaker, CHF, anxiety, dementia, is being direct admitted from [facility] emergency room with a chief complaint back pain. She underwent a back surgery in [place] by Dr Donivan Scull in 06/2014 and was transferred to rehab and since then, she had been bed bound, had more falls and has been lethargic from the pain meds. The family reports she has no appetite and doesn't eat and does n't participate in therapy.  She also has been on and off antibiotics since then. She presented to [facility] with severe back and was transferred here for further evaluation. On arrival to [facility], she was encephalopathic probably from the medications, confused and hypotensive. She responded briefly to narcan, but became very agitated and we had to give her benzodiazepines. CT lumbar spine ordered which showed progression of erosive destructive changes at the L4 L5 level consistent with persistent and worsened discitis osteomyelitis. the IR and ID consulted for recommendations. She underwent IR guided lumbar disc aspiration on 12/8 and antibiotics were started after the aspiration by ID.  Speech eval recommended dysphagia 1 diet with thin liquids.  12/10 Patient remains confused and CT head without contrast is unremarkable. EEG ordered.  12/11 hypotension, asymptomatic, one liter fluid bolus given and bp improved. Elevated lactic acid of 2.2.  12/12 pt refused EEG and refusing to eat, and care. Discussed with family and consulted palliative care for goals of care and hospice.   12/13 lactic acid is normal today, she is more awake and alert and requesting food. Palliative care  consult pending.   Assessment/Plan: #1 acute on chronic osteomyelitis/discitis CT L-spine showing progression and worsening of osteomyelitis at L4-L5. Patient underwent this aspiration. Interventional radiology 12 8 and patient placed on IV antibiotics post aspiration per ID recommendations. Also noted on this aspirate was candida. Patient currently on IV vancomycin and IV Rocephin and is on antibiotic day #6/42. Will also start patient on IV eraxis day #1/42. Patient will need to follow-up with ID as outpatient. PICC line is currently being placed. Patient has been seen in consultation by palliative care and patient likely to be discharged on IV antibiotics with palliative care to follow-up at the facility.  #2 acute encephalopathy Questionable etiology. May be secondary to polypharmacy as patient was noted to be on methadone as well as Xanax. Methadone dose has been decreased and is being tapered. Patient currently on 5 mg twice daily which may be reduced to once daily. B-12, TSH within normal limits. CT head negative for any acute abnormalities. Patient with pacemaker and a such MRI of head could not be done. EEG was ordered however patient refused EEG. Patient and family have met with palliative care and patient will likely go back to facility with palliative care following.  #3 acute renal failure Improved.  #4 anemia H&H stable.  #5 COPD Stable. Continue nebs as needed.  #6 hypokalemia Repleted.  #7 hypomagnesemia  repleted.  #8 hypernatremia Resolved with hydration.  #9 unstageable left heel pressure ulcer Continue current dressing changes. Preva lumbar roots.  #10 failure to thrive/refusing care Patient with a failure to thrive with decreased appetite and deteriorating over the past few months. Palliative care has been consulted and patient was seen in  consultation by Dr. Rowe Pavy and patient is currently DO NOT RESUSCITATE/DO NOT INTUBATE. Continue pain management. Patient to  skilled nursing facility with IV antibiotics and palliative care to consulted a skilled nursing facility through [facility] [place].  Code Status: DO NOT RESUSCITATE Family Communication: Updated patient. No family at bedside.  Disposition Plan: Back to SNF with IV antibiotics and palliative care following, hopefully tomorrow.   Consultants:  Palliative care: Dr.Anwar 12/23/2014  Neurology: Dr. Janann Colonel 12/19/2014  Infectious disease: Dr. Johnnye Sima 12/16/2014  Procedures:  CT L spine 12/16/2014  CT head 12/19/2014  Chest x-ray 12/19/2014  PICC line 12/23/2014  Antibiotics:  IV vancomycin 12/17/2014  IV Rocephin 12/17/2014  HPI/Subjective: Patient in bed getting ready for PICC line. Patient complaining of back pain. Patient alert and answering questions appropriately.  Objective: Filed Vitals:   12/23/14 0548 12/23/14 0751  BP: 109/79 98/44  Pulse: 65 90  Temp: 97.6 F (36.4 C) 98.4 F (36.9 C)  Resp: 16 16    Intake/Output Summary (Last 24 hours) at 12/23/14 1508 Last data filed at 12/23/14 1407  Gross per 24 hour  Intake    240 ml  Output    300 ml  Net    -60 ml   Filed Weights   12/18/14 2015 12/19/14 2155 12/21/14 2137  Weight: 85.6 kg (188 lb 11.4 oz) 84.7 kg (186 lb 11.7 oz) 85.1 kg (187 lb 9.8 oz)    Exam:   General:  NAD  Cardiovascular: RRR  Respiratory: CTA anterior lung fields.  Abdomen: Soft, nontender, nondistended, positive bowel sounds.  Musculoskeletal: No clubbing cyanosis or edema.  Data Reviewed: Basic Metabolic Panel:  Recent Labs Lab 12/18/14 0315 12/18/14 1347 12/19/14 0600 12/19/14 1250 12/20/14 0430 12/21/14 0725 12/23/14 1207  NA 145  --  146*  --  144 145 142  K 3.0*  --  2.9*  --  3.2* 3.7 3.8  CL 114*  --  113*  --  112* 115* 112*  CO2 26  --  25  --  _0 GLUCOSE 120*  --  96  --  110* 86 110*  BUN 20  --  11  --  _1 CREATININE 0.67  --  0.58  --  0.64 0.78 1.04*  CALCIUM 8.8*  --  8.5*   --  8.4* 8.1* 8.7*  MG  --  1.5*  --  1.7  --   --   --    Liver Function Tests:  Recent Labs Lab 12/19/14 2120  AST 20  ALT 12*  ALKPHOS 65  BILITOT 0.6  PROT 4.4*  ALBUMIN 1.9*   No results for input(s): LIPASE, AMYLASE in the last 168 hours.  Recent Labs Lab 12/19/14 2120  AMMONIA 34   CBC:  Recent Labs Lab 12/17/14 0650 12/18/14 0315 12/19/14 0600 12/20/14 0430 12/21/14 0725  WBC 6.9 6.6 6.3 6.4 5.7  HGB 9.2* 8.3* 7.5* 7.9* 8.3*  HCT 30.3* 26.0* 23.5* 25.3* 27.2*  MCV 84.9 82.8 82.2 83.8 84.7  PLT 117* 106* 98* 102* 123*   Cardiac Enzymes: No results for input(s): CKTOTAL, CKMB, CKMBINDEX, TROPONINI in the last 168 hours. BNP (last 3 results) No results for input(s): BNP in the last 8760 hours.  ProBNP (last 3 results) No results for input(s): PROBNP in the last 8760 hours.  CBG:  Recent Labs Lab 12/22/14 1619 12/22/14 2053 12/23/14 0747 12/23/14 0819 12/23/14 1133  GLUCAP 123* 72 68 94 100*  Recent Results (from the past 240 hour(s))  Fungus Culture with Smear     Status: None (Preliminary result)   Collection Time: 12/17/14  3:52 PM  Result Value Ref Range Status   Specimen Description ABSCESS  Final   Special Requests L4 L5 DISC ASPIRATION  Final   Fungal Smear   Final    NO YEAST OR FUNGAL ELEMENTS SEEN Performed at [facility]    Culture   Final    YEAST ISOLATED;ID TO FOLLOW Performed at [facility]    Report Status PENDING  Incomplete  Culture, routine-abscess     Status: None   Collection Time: 12/17/14  3:52 PM  Result Value Ref Range Status   Specimen Description ABSCESS  Final   Special Requests L4 L5 DISC ASPIRATION  Final   Gram Stain   Final    NO WBC SEEN NO SQUAMOUS EPITHELIAL CELLS SEEN NO ORGANISMS SEEN Performed at [facility]    Culture   Final    NO GROWTH 3 DAYS Performed at [facility]    Report Status 12/21/2014 FINAL  Final  Urine culture     Status: None    Collection Time: 12/20/14  5:21 PM  Result Value Ref Range Status   Specimen Description URINE, RANDOM  Final   Special Requests NONE  Final   Culture >=100,000 COLONIES/mL YEAST  Final   Report Status 12/22/2014 FINAL  Final     Studies: No results found.  Scheduled Meds: . [START ON 12/24/2014] anidulafungin  100 mg Intravenous Q24H  . antiseptic oral rinse  7 mL Mouth Rinse BID  . aspirin EC  81 mg Oral Daily  . cefTRIAXone (ROCEPHIN)  IV  2 g Intravenous Q24H  . dextrose  25 mL Intravenous Once  . feeding supplement (ENSURE ENLIVE)  237 mL Oral TID BM  . fesoterodine  4 mg Oral Daily  . insulin aspart  0-9 Units Subcutaneous TID WC  . methadone  5 mg Oral BID  . OLANZapine  2.5 mg Oral QHS  . polyethylene glycol  17 g Oral Daily  . pregabalin  150 mg Oral BID  . rosuvastatin  20 mg Oral Daily  . saccharomyces boulardii  250 mg Oral Daily  . senna-docusate  2 tablet Oral BID  . silver sulfADIAZINE  1 application Topical QPM  . sodium chloride  3 mL Intravenous Q12H  . tiotropium  18 mcg Inhalation Daily  . traZODone  100 mg Oral QHS  . vancomycin  1,250 mg Intravenous Q48H   Continuous Infusions: . dextrose 5 % and 0.9% NaCl      Active Problems:   Discitis   COPD (chronic obstructive pulmonary disease) (HCC)   HTN (hypertension)   CAD (coronary artery disease)   Pacemaker   Anxiety state   Arthritis   Diabetes ([place])   Pressure ulcer   Acute osteomyelitis ([place])   Low back pain   Encounter for palliative care    Time spent: 81 mins    [facility] MD [facility] Pager [phone]. If 7PM-7AM, please contact night-coverage at [email or web address], password [facility] 12/23/2014, 3:08 PM  LOS: 8 days

## 2014-12-23 NOTE — Progress Notes (Signed)
INFECTIOUS DISEASE PROGRESS NOTE  ID: Kellie Acosta is a 69 y.o. female with  Active Problems:   Discitis   COPD (chronic obstructive pulmonary disease) (HCC)   HTN (hypertension)   CAD (coronary artery disease)   Pacemaker   Anxiety state   Arthritis   Diabetes (HCC)   Pressure ulcer   Acute osteomyelitis (HCC)   Low back pain   Encounter for palliative care  Subjective: Confused C/o pain in her legs.   Abtx:  Anti-infectives    Start     Dose/Rate Route Frequency Ordered Stop   12/24/14 0900  anidulafungin (ERAXIS) 100 mg in sodium chloride 0.9 % 100 mL IVPB     100 mg over 90 Minutes Intravenous Every 24 hours 12/23/14 0852     12/23/14 0900  anidulafungin (ERAXIS) 200 mg in sodium chloride 0.9 % 200 mL IVPB     200 mg over 180 Minutes Intravenous  Once 12/23/14 0852 12/23/14 1309   12/21/14 0600  vancomycin (VANCOCIN) 1,250 mg in sodium chloride 0.9 % 250 mL IVPB     1,250 mg 166.7 mL/hr over 90 Minutes Intravenous Every 48 hours 12/20/14 2044     12/17/14 1700  vancomycin (VANCOCIN) IVPB 750 mg/150 ml premix  Status:  Discontinued     750 mg 150 mL/hr over 60 Minutes Intravenous Every 12 hours 12/17/14 1607 12/20/14 0547   12/17/14 1600  cefTRIAXone (ROCEPHIN) 2 g in dextrose 5 % 50 mL IVPB     2 g 100 mL/hr over 30 Minutes Intravenous Every 24 hours 12/17/14 1539        Medications:  Scheduled: . [START ON 12/24/2014] anidulafungin  100 mg Intravenous Q24H  . antiseptic oral rinse  7 mL Mouth Rinse BID  . aspirin EC  81 mg Oral Daily  . cefTRIAXone (ROCEPHIN)  IV  2 g Intravenous Q24H  . dextrose  25 mL Intravenous Once  . feeding supplement (ENSURE ENLIVE)  237 mL Oral TID BM  . fesoterodine  4 mg Oral Daily  . insulin aspart  0-9 Units Subcutaneous TID WC  . methadone  5 mg Oral BID  . OLANZapine  2.5 mg Oral QHS  . polyethylene glycol  17 g Oral Daily  . pregabalin  150 mg Oral BID  . rosuvastatin  20 mg Oral Daily  . saccharomyces boulardii  250  mg Oral Daily  . senna-docusate  2 tablet Oral BID  . silver sulfADIAZINE  1 application Topical QPM  . sodium chloride  3 mL Intravenous Q12H  . tiotropium  18 mcg Inhalation Daily  . traZODone  100 mg Oral QHS  . vancomycin  1,250 mg Intravenous Q48H    Objective: Vital signs in last 24 hours: Temp:  [97.5 F (36.4 C)-98.4 F (36.9 C)] 97.8 F (36.6 C) (12/14 1641) Pulse Rate:  [65-99] 99 (12/14 1641) Resp:  [16-17] 17 (12/14 1641) BP: (98-136)/(44-79) 136/68 mmHg (12/14 1641) SpO2:  [95 %-99 %] 96 % (12/14 1641)   General appearance: alert, delirious and mild distress Resp: clear to auscultation bilaterally Cardio: regular rate and rhythm GI: normal findings: bowel sounds normal and soft, non-tender  Lab Results  Recent Labs  12/21/14 0725 12/23/14 1207  WBC 5.7  --   HGB 8.3*  --   HCT 27.2*  --   NA 145 142  K 3.7 3.8  CL 115* 112*  CO2 22 23  BUN 7 8  CREATININE 0.78 1.04*   Liver Panel No results  for input(s): PROT, ALBUMIN, AST, ALT, ALKPHOS, BILITOT, BILIDIR, IBILI in the last 72 hours. Sedimentation Rate No results for input(s): ESRSEDRATE in the last 72 hours. C-Reactive Protein No results for input(s): CRP in the last 72 hours.  Microbiology: Recent Results (from the past 240 hour(s))  Fungus Culture with Smear     Status: None (Preliminary result)   Collection Time: 12/17/14  3:52 PM  Result Value Ref Range Status   Specimen Description ABSCESS  Final   Special Requests L4 L5 DISC ASPIRATION  Final   Fungal Smear   Final    NO YEAST OR FUNGAL ELEMENTS SEEN Performed at [facility]    Culture   Final    YEAST ISOLATED;ID TO FOLLOW Performed at [facility]    Report Status PENDING  Incomplete  Culture, routine-abscess     Status: None   Collection Time: 12/17/14  3:52 PM  Result Value Ref Range Status   Specimen Description ABSCESS  Final   Special Requests L4 L5 DISC ASPIRATION  Final   Gram Stain   Final    NO WBC  SEEN NO SQUAMOUS EPITHELIAL CELLS SEEN NO ORGANISMS SEEN Performed at [facility]    Culture   Final    NO GROWTH 3 DAYS Performed at [facility]    Report Status 12/21/2014 FINAL  Final  Urine culture     Status: None   Collection Time: 12/20/14  5:21 PM  Result Value Ref Range Status   Specimen Description URINE, RANDOM  Final   Special Requests NONE  Final   Culture >=100,000 COLONIES/mL YEAST  Final   Report Status 12/22/2014 FINAL  Final    Studies/Results: No results found.   Assessment/Plan: Osteomyelitis, Discitis   Total days of antibiotics: 6 vanco/ceftriaxone  Start eraxis Stop vanco/ceftriaxone await Cx Now has PIC Check BCx My great appreciation to Dr Darlina Rumpfhompson         Jeffrey Hatcher Infectious Diseases (pager) [phone] [email or web address] 12/23/2014, 5:34 PM  LOS: 8 days

## 2014-12-23 NOTE — Clinical Social Work Note (Signed)
Clinical Social Work Assessment  Patient Details  Name: Kellie Acosta MRN: [id number] Date of Birth: [birth year]  Date of referral:  12/16/14               Reason for consult:  Facility Placement                Permission sought to share information with:  Other (CSW contacted son as patient only oriented to self) Permission granted to share information::  No (Permission not given by patient as only oriented to self)  Name::     Kellie Acosta  Agency::     Relationship::  Spouse  Contact Information:  [phone]  Housing/Transportation Living arrangements for the past 2 months:  [facility] Nursing [facility] ([facility] Source of Information:  Spouse, Kellie SineRick Gorniak, chart and shadow chart.  Patient Interpreter Needed:  None Criminal Activity/Legal Involvement Pertinent to Current Situation/Hospitalization:  No - Comment as needed Significant Relationships: Spouse, other family members Lives with:  Facility Resident Do you feel safe going back to the place where you live?  Yes Need for family participation in patient care:  Yes (Comment)  Care giving concerns:  None expressed by spouse   Office managerocial Worker assessment / plan:  CSW talked with patient at the patient's room and [facility] Mr. Dayton ScrapeMurray reported that they are having a meeting to talk about the disposition plan for patient. Spouse informed that CSW will f/u regarding d/c plan.   Employment status:  Retired Health and safety inspectornsurance information:  Medicare PT Recommendations:  Skilled Nursing Facility Information / Referral to community resources:  Other (Comment Required) (Patient from facility)  Patient/Family's Response to care:  No concerns reported.  Patient/Family's Understanding of and Emotional Response to Diagnosis, Current Treatment, and Prognosis:  Husband at hospital on 12/14 to discuss goals of care and disposition with palliative care.   Emotional Assessment Appearance:  Appears stated  age Attitude/Demeanor/Rapport:  Unable to Assess Affect (typically observed):  Unable to Assess Orientation:  Oriented to Self Alcohol / Substance use:  Never Used (Patient reports that she has never smoked and does not drink or use illicit drugs.) Psych involvement (Current and /or in the community):  No (Comment)  Discharge Needs  Concerns to be addressed:  Discharge Planning Concerns Readmission within the last 30 days:  No Current discharge risk:  None Barriers to Discharge:  No Barriers Identified   Kellie Acosta, Kellie Calo Bradley, LCSW 12/23/2014, 2:22 PM

## 2014-12-23 NOTE — Progress Notes (Signed)
Palliative Medicine Team consult was received.   Family meeting scheduled with patient and her family for 10:30am tomorrow.  Dr. Linna DarnerAnwar will likely be provider from palliative to meet with family.  If there are urgent needs or questions please call [phone]. Thank you for consulting out team to assist with this patients care.  Romie MinusGene Ameera Tigue, MD [facility] Palliative Medicine Team [phone]

## 2014-12-23 NOTE — Progress Notes (Signed)
ANTIBIOTIC CONSULT NOTE - FOLLOW UP  Pharmacy Consult for Vancomycin/Eraxis Indication: Osteomyelitis/discitis  Allergies  Allergen Reactions  . Codeine   . Meperidine And Related   . Penicillins Itching  . Pentazocine     Patient Measurements: Height:  (167.6 cm) (estimated) Weight: 187 lb 9.8 oz (85.1 kg) IBW/kg (Calculated) : 59.3 Adjusted Body Weight:   Vital Signs: Temp: 98.4 F (36.9 C) (12/14 0751) Temp Source: Oral (12/14 0751) BP: 98/44 mmHg (12/14 0751) Pulse Rate: 90 (12/14 0751) Intake/Output from previous day: 12/13 0701 - 12/14 0700 In: 638 [P.O.:638] Out: 500 [Urine:500] Intake/Output from this shift:    Labs:  Recent Labs  12/21/14 0725  WBC 5.7  HGB 8.3*  PLT 123*  CREATININE 0.78   Estimated Creatinine Clearance: 70.9 mL/min (by C-G formula based on Cr of 0.78).  Recent Labs  12/20/14 1830  VANCORANDOM 20     Microbiology: Recent Results (from the past 720 hour(s))  Fungus Culture with Smear     Status: None (Preliminary result)   Collection Time: 12/17/14  3:52 PM  Result Value Ref Range Status   Specimen Description ABSCESS  Final   Special Requests L4 L5 DISC ASPIRATION  Final   Fungal Smear   Final    NO YEAST OR FUNGAL ELEMENTS SEEN Performed at [facility]    Culture   Final    YEAST ISOLATED;ID TO FOLLOW Performed at [facility]    Report Status PENDING  Incomplete  Culture, routine-abscess     Status: None   Collection Time: 12/17/14  3:52 PM  Result Value Ref Range Status   Specimen Description ABSCESS  Final   Special Requests L4 L5 DISC ASPIRATION  Final   Gram Stain   Final    NO WBC SEEN NO SQUAMOUS EPITHELIAL CELLS SEEN NO ORGANISMS SEEN Performed at [facility]    Culture   Final    NO GROWTH 3 DAYS Performed at [facility]    Report Status 12/21/2014 FINAL  Final  Urine culture     Status: None   Collection Time: 12/20/14  5:21 PM  Result Value Ref Range  Status   Specimen Description URINE, RANDOM  Final   Special Requests NONE  Final   Culture >=100,000 COLONIES/mL YEAST  Final   Report Status 12/22/2014 FINAL  Final    Anti-infectives    Start     Dose/Rate Route Frequency Ordered Stop   12/21/14 0600  vancomycin (VANCOCIN) 1,250 mg in sodium chloride 0.9 % 250 mL IVPB     1,250 mg 166.7 mL/hr over 90 Minutes Intravenous Every 48 hours 12/20/14 2044     12/17/14 1700  vancomycin (VANCOCIN) IVPB 750 mg/150 ml premix  Status:  Discontinued     750 mg 150 mL/hr over 60 Minutes Intravenous Every 12 hours 12/17/14 1607 12/20/14 0547   12/17/14 1600  cefTRIAXone (ROCEPHIN) 2 g in dextrose 5 % 50 mL IVPB     2 g 100 mL/hr over 30 Minutes Intravenous Every 24 hours 12/17/14 1539        Assessment: 69yo female continuing on Vancomycin/CTX x 6 weeks (Day #6/42) for osteomyelitis/ discitis s/p laminectomy in June, now to start anidulafungin for 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 Planning 6 weeks of abx IV per ID - will need PICC. AFeb, WBC wnl, LA trend down. Palliative consult for GOC/hospice.  12/8 Fungus Culture from Intervertebral Disc Abscess - No yeast or Fungal elements td 12/8  abscess:  ngf 12/11 urine: >100k yeast  PCP started Zyvox PO 11/28, not on med rec.  Anidulafungin 12/14>> Rocephin 12/8>> Vanc 12/8>>   12/11:  VT 26 (while on 750mg  q12) 12/11:  VR 20, 14hr between levels.  True ke = 0.018, t1/2 = 37h   Goal of Therapy:  Vancomycin trough level 15-20 mcg/ml  Plan:  Start anidulafungin 200mg  IV x1, then 100mg  IV q24h Vancomycin to 1250mg  IV q48h CTX 2g IV q24h Watch renal fxn closely, monitor c/s, clinical progress Repeat VT at steady state (prior to 4th dose = 12/18)  Babs BertinHaley Shaheen Star, PharmD, BCPS Clinical Pharmacist Pager [phone] 12/23/2014 8:49 AM

## 2014-12-23 NOTE — Progress Notes (Signed)
Peripherally Inserted Central Catheter/Midline Placement  The IV Nurse has discussed with the patient and/or persons authorized to consent for the patient, the purpose of this procedure and the potential benefits and risks involved with this procedure.  The benefits include less needle sticks, lab draws from the catheter and patient may be discharged home with the catheter.  Risks include, but not limited to, infection, bleeding, blood clot (thrombus formation), and puncture of an artery; nerve damage and irregular heat beat.  Alternatives to this procedure were also discussed.  PICC/Midline Placement Documentation  PICC Single Lumen 12/23/14 PICC Right Basilic 41 cm 3 cm (Active)  Indication for Insertion or Continuance of Line Home intravenous therapies (PICC only) 12/23/2014  4:00 PM  Exposed Catheter (cm) 3 cm 12/23/2014  4:00 PM  Dressing Change Due 12/30/14 12/23/2014  4:00 PM    Telephone Consent   Stacie GlazeJoyce, Tsugio Elison Horton 12/23/2014, 4:22 PM

## 2014-12-24 DIAGNOSIS — I10 Essential (primary) hypertension: Secondary | ICD-10-CM

## 2014-12-24 DIAGNOSIS — M4626 Osteomyelitis of vertebra, lumbar region: Secondary | ICD-10-CM | POA: Diagnosis present

## 2014-12-24 LAB — CBC
HCT: 24.7 % — ABNORMAL LOW (ref 36.0–46.0)
HEMOGLOBIN: 7.4 g/dL — AB (ref 12.0–15.0)
MCH: 26.1 pg (ref 26.0–34.0)
MCHC: 30 g/dL (ref 30.0–36.0)
MCV: 87 fL (ref 78.0–100.0)
Platelets: 180 10*3/uL (ref 150–400)
RBC: 2.84 MIL/uL — AB (ref 3.87–5.11)
RDW: 19.4 % — ABNORMAL HIGH (ref 11.5–15.5)
WBC: 4.3 10*3/uL (ref 4.0–10.5)

## 2014-12-24 LAB — BASIC METABOLIC PANEL
Anion gap: 5 (ref 5–15)
BUN: 7 mg/dL (ref 6–20)
CALCIUM: 8.1 mg/dL — AB (ref 8.9–10.3)
CHLORIDE: 113 mmol/L — AB (ref 101–111)
CO2: 24 mmol/L (ref 22–32)
CREATININE: 0.93 mg/dL (ref 0.44–1.00)
Glucose, Bld: 116 mg/dL — ABNORMAL HIGH (ref 65–99)
Potassium: 3.5 mmol/L (ref 3.5–5.1)
SODIUM: 142 mmol/L (ref 135–145)

## 2014-12-24 LAB — GLUCOSE, CAPILLARY
GLUCOSE-CAPILLARY: 109 mg/dL — AB (ref 65–99)
GLUCOSE-CAPILLARY: 99 mg/dL (ref 65–99)
Glucose-Capillary: 104 mg/dL — ABNORMAL HIGH (ref 65–99)

## 2014-12-24 MED ORDER — ALPRAZOLAM 0.25 MG PO TABS
0.2500 mg | ORAL_TABLET | Freq: Two times a day (BID) | ORAL | Status: DC | PRN
  Administered 2014-12-24: 0.25 mg via ORAL
  Filled 2014-12-24: qty 1

## 2014-12-24 MED ORDER — POLYETHYLENE GLYCOL 3350 17 G PO PACK: 17.0000 g | PACK | Freq: Every day | ORAL | Status: AC

## 2014-12-24 MED ORDER — METHADONE HCL 5 MG PO TABS: 5.0000 mg | ORAL_TABLET | Freq: Two times a day (BID) | ORAL | Status: AC

## 2014-12-24 MED ORDER — OLANZAPINE 2.5 MG PO TABS: 2.5000 mg | ORAL_TABLET | Freq: Every day | ORAL | Status: AC

## 2014-12-24 MED ORDER — ENSURE ENLIVE PO LIQD: 237.0000 mL | Freq: Three times a day (TID) | ORAL | Status: AC

## 2014-12-24 MED ORDER — HEPARIN SOD (PORK) LOCK FLUSH 100 UNIT/ML IV SOLN
250.0000 [IU] | INTRAVENOUS | Status: AC | PRN
  Administered 2014-12-24: 250 [IU]

## 2014-12-24 MED ORDER — TRAMADOL HCL 50 MG PO TABS: 50.0000 mg | ORAL_TABLET | Freq: Four times a day (QID) | ORAL | Status: AC | PRN

## 2014-12-24 MED ORDER — TRAMADOL HCL 50 MG PO TABS: 50.0000 mg | ORAL_TABLET | Freq: Four times a day (QID) | ORAL | Status: DC | PRN

## 2014-12-24 MED ORDER — ALPRAZOLAM 1 MG PO TABS: 0.5000 mg | ORAL_TABLET | Freq: Three times a day (TID) | ORAL | Status: AC | PRN

## 2014-12-24 MED ORDER — POTASSIUM CHLORIDE CRYS ER 20 MEQ PO TBCR
40.0000 meq | EXTENDED_RELEASE_TABLET | Freq: Once | ORAL | Status: AC
  Administered 2014-12-24: 40 meq via ORAL
  Filled 2014-12-24: qty 2

## 2014-12-24 MED ORDER — SODIUM CHLORIDE 0.9 % IV SOLN: 100.0000 mg | INTRAVENOUS | Status: AC

## 2014-12-24 MED ORDER — SENNOSIDES-DOCUSATE SODIUM 8.6-50 MG PO TABS: 2.0000 | ORAL_TABLET | Freq: Two times a day (BID) | ORAL | Status: AC

## 2014-12-24 NOTE — Discharge Summary (Signed)
Physician Discharge Summary  Elzada Pytel PRF:[id number] DOB: 1945/12/09 DOA: 12/15/2014  PCP: No primary care provider on file.  Admit date: 12/15/2014 Discharge date: 12/24/2014  Time spent: 65 minutes  Recommendations for Outpatient Follow-up:  1. Patient will be discharged to a skilled nursing facility and will need palliative care through hospice of [facility] to follow patient at skilled nursing facility. Patient be discharged on 41 more days of IV eraxis. 2. Follow-up with M.D. at skilled nursing facility. Blood cultures obtained on 12/23/2014 with a to be followed up upon. Patient will need a basic metabolic profile done in 1 week to follow-up on electrolytes and renal function. 3. Follow-up with Dr. Johnnye Sima, infectious diseases in 4 weeks.   Discharge Diagnoses:  Principal Problem:   Osteomyelitis of lumbar spine ([place]) Active Problems:   Discitis   COPD (chronic obstructive pulmonary disease) ([facility])   HTN (hypertension)   CAD (coronary artery disease)   Pacemaker   Anxiety state   Arthritis   Diabetes ([place])   Pressure ulcer   Acute osteomyelitis ([place])   Low back pain   Encounter for palliative care   Acute encephalopathy   Discharge Condition: Stable and improved  Diet recommendation: Regular  Filed Weights   12/19/14 2155 12/21/14 2137 12/23/14 2022  Weight: 84.7 kg (186 lb 11.7 oz) 85.1 kg (187 lb 9.8 oz) 82.8 kg (182 lb 8.7 oz)    History of present illness:  Per Dr Clent Demark is a 69 y.o. female has a past medical history significant for hypertension, hyperlipidemia, polypharmacy, prior history of stroke, prior history of paroxysmal A. fib now with a pacemaker, CHF, anxiety, dementia, was being directly admitted from [facility] emergency room with a chief complaint back pain. Patient apparently had laminectomy done by Dr. Donivan Scull in [place] in June 2016 and since then she has been having a lot of issues with her back pain, lower extremity  weakness and inability to walk without excruciating pain. On admission, patient was quite lethargic, she woke up and was able to answer basic questions however falls back asleep right away. Per nursing report, she received narcotics medication prior to transfer as well as reportedly methadone. The only thing she complained of was back pain. She denied any chest pain, denied any shortness of breath, she denied any abdominal pain, nausea, vomiting or diarrhea. Admitting MD, was able to talk with her husband over the phone, he confirmed that she'd been having a lot of back issues and she'd been on and off on antibiotics since her surgery in June. Most recently the patient was started on Zyvox for her PCP about a week ago. She had an x-ray of her spine in the ED at [facility] which showed progression of osteomyelitis, and patient was transferred here for further workup. Patient's husband was a poor historian as well, and unable to add more to the story.  In addition, per report from the ED, she takes methadone, Xanax, and tramadol, and all 3 on the day of admission. The husband stated to the ED physician as well as to admitting MD over the phone, that she usually is taking her medications and then sleeps for long periods of time. There are reports that she's been refusing to walk due to pain as well as refusing physical therapy at the nursing home   In [place] ED, she was afebrile, heart rate 70, blood pressure 106/57 satting 98% on 2 L. She uses oxygen at home. White count in the ED was  6.4, hemoglobin 9.1, platelets 150. We didn't have her renal function from day of admission, 1 day prior to admission, she had a BUN of 50 and a creatinine of 1.1.   Hospital Course:  #1 acute on chronic osteomyelitis/discitis secondary to yeast CT L-spine on admission showed progression and worsening of osteomyelitis at L4-L5. Patient underwent  Aspiration by Interventional radiology 12/ 8 and patient placed on IV antibiotics  post aspiration per ID recommendations. Also noted on this aspirate was candida. Patient received 6 days of IV vancomycin and IV Rocephin. Patient was subsequently started on IV eraxis and antibiotics discontinued per ID recommendations. Patient will need 6 weeks of IV antifungal/eraxis. Repeat blood cultures were obtained prior to discharge and these will need to be followed up upon. Patient will need to follow-up with ID as outpatient in approximately 4 weeks. PICC line was placed. Patient has been seen in consultation by palliative care and patient likely to be discharged on IV antifungal medications, with palliative care to follow-up at the facility.  #2 acute encephalopathy Questionable etiology. May be secondary to polypharmacy as patient was noted to be on methadone as well as Xanax and ultram. Methadone dose has been decreased and is being tapered. Patient currently on 5 mg twice daily which may be reduced to once daily in 1-2 weeks if deemed appropriate. Patient be discharged on 5 mg twice daily methadone as well as Ultram for breakthrough pain. B-12, TSH within normal limits. CT head negative for any acute abnormalities. Patient with pacemaker and as such MRI of head could not be done. EEG was ordered, however patient refused EEG. Patient and family have met with palliative care and patient will likely go back to facility with palliative care following.  #3 acute renal failure Result with hydration.  #4 anemia H&H stable.  #5 COPD Stable. Patient was placed on nebs as needed.  #6 hypokalemia Repleted.  #7 hypomagnesemia  repleted.  #8 hypernatremia Resolved with hydration.  #9 unstageable left heel pressure ulcer Patient was seen by wound care and dressing changes recommended. Prevalon roots.  #10 failure to thrive/refusing care Patient with a failure to thrive with decreased appetite and deteriorating over the past few months. Palliative care has been consulted and patient was  seen in consultation by Dr. Rowe Pavy and patient is currently DO NOT RESUSCITATE/DO NOT INTUBATE. Continued on pain management. Patient to skilled nursing facility with IV antifungal medication for 6 weeks, and palliative care to consult at skilled nursing facility through hospice of [facility].   Procedures:  CT L spine 12/16/2014  CT head 12/19/2014  Chest x-ray 12/19/2014  PICC line 12/23/2014  Consultations:  Palliative care: Dr.Anwar 12/23/2014  Neurology: Dr. Janann Colonel 12/19/2014  Infectious disease: Dr. Johnnye Sima 12/16/2014    Discharge Exam: Filed Vitals:   12/24/14 0420 12/24/14 0752  BP: 107/64 152/69  Pulse: 78 99  Temp: 98.4 F (36.9 C) 97.7 F (36.5 C)  Resp: 19 17    General: NAD. Tearful. Cardiovascular: RRR Respiratory: CTAB  Discharge Instructions   Discharge Instructions    Diet general    Complete by:  As directed      Discharge instructions    Complete by:  As directed   Will need Palliative care to follow at facility. Follow up with ID in 4 weeks.     Increase activity slowly    Complete by:  As directed           Current Discharge Medication List    START  taking these medications   Details  anidulafungin 100 mg in sodium chloride 0.9 % 100 mL Inject 100 mg into the vein daily. Take for 41 days. Qty: 4100 mg, Refills: 0    feeding supplement, ENSURE ENLIVE, (ENSURE ENLIVE) LIQD Take 237 mLs by mouth 3 (three) times daily between meals. Qty: 237 mL, Refills: 12    OLANZapine (ZYPREXA) 2.5 MG tablet Take 1 tablet (2.5 mg total) by mouth at bedtime. Qty: 30 tablet, Refills: 0    polyethylene glycol (MIRALAX / GLYCOLAX) packet Take 17 g by mouth daily. Qty: 14 each, Refills: 0    senna-docusate (SENOKOT-S) 8.6-50 MG tablet Take 2 tablets by mouth 2 (two) times daily.      CONTINUE these medications which have CHANGED   Details  ALPRAZolam (XANAX) 1 MG tablet Take 0.5 tablets (0.5 mg total) by mouth every 8 (eight) hours as needed  for anxiety. Qty: 20 tablet, Refills: 0    methadone (DOLOPHINE) 5 MG tablet Take 1 tablet (5 mg total) by mouth every 12 (twelve) hours. Qty: 20 tablet, Refills: 0    traMADol (ULTRAM) 50 MG tablet Take 1 tablet (50 mg total) by mouth every 6 (six) hours as needed. Qty: 20 tablet, Refills: 0      CONTINUE these medications which have NOT CHANGED   Details  aspirin EC 81 MG tablet Take 81 mg by mouth daily.    furosemide (LASIX) 40 MG tablet Take 40 mg by mouth daily.    Lactobacillus (FLORAJEN ACIDOPHILUS) CAPS Take 1 capsule by mouth daily.    Melatonin 3 MG TABS Take 6 mg by mouth at bedtime.    pregabalin (LYRICA) 150 MG capsule Take 150 mg by mouth 2 (two) times daily.    silver sulfADIAZINE (SILVADENE) 1 % cream Apply 1 application topically every evening. Outer left heel    tolterodine (DETROL) 2 MG tablet Take 4 mg by mouth daily.    traZODone (DESYREL) 100 MG tablet Take 100 mg by mouth at bedtime.      STOP taking these medications     rosuvastatin (CRESTOR) 20 MG tablet      tiotropium (SPIRIVA) 18 MCG inhalation capsule      tolterodine (DETROL LA) 4 MG 24 hr capsule        Allergies  Allergen Reactions  . Codeine   . Meperidine And Related   . Penicillins Itching  . Pentazocine    Follow-up Information    Please follow up.   Why:  f/u with MD at facility      Please follow up.   Why:  F/U with palliative care at facility.      Follow up with Bobby Rumpf, MD. Schedule an appointment as soon as possible for a visit in 4 weeks.   Specialty:  Infectious Diseases   Contact information:   [address] [place] [address] [phone]        The results of significant diagnostics from this hospitalization (including imaging, microbiology, ancillary and laboratory) are listed below for reference.    Significant Diagnostic Studies: Ct Head Wo Contrast  12/19/2014  CLINICAL DATA:  Encephalopathic, confusion and hypotensive,  possibly due to medication. History of hypertension, stroke, diabetes, dementia. EXAM: CT HEAD WITHOUT CONTRAST TECHNIQUE: Contiguous axial images were obtained from the base of the skull through the vertex without intravenous contrast. COMPARISON:  CT head November 06, 2014 FINDINGS: The ventricles and sulci are normal for age. No intraparenchymal hemorrhage, mass effect  nor midline shift. Patchy supratentorial white matter hypodensities are within normal range for patient's age and though non-specific suggest sequelae of chronic small vessel ischemic disease. No acute large vascular territory infarcts. No abnormal extra-axial fluid collections. Basal cisterns are patent. Mild calcific atherosclerosis of the carotid siphons. No skull fracture. 8 mm bony excrescence inner table RIGHT temporal calvarium likely represents a meningioma without mass effect. The included ocular globes and orbital contents are non-suspicious. Paranasal sinus are well aerated. Status post bilateral wall up mastoidectomy's, increasing soft tissue within the LEFT surgical bed, the RIGHT is well-aerated. IMPRESSION: No acute intracranial process. Involutional changes. Mild to moderate chronic small vessel ischemic disease. Status post bilateral wall up mastoidectomy's. Increasing soft tissue within LEFT surgical bed, recommend ENT consultation on a nonemergent basis. Electronically Signed   By: Elon Alas M.D.   On: 12/19/2014 01:38   Ct Lumbar Spine W Contrast  12/16/2014  CLINICAL DATA:  Personal history of previous laminectomy. Low back pain without described radiation. Previous diagnosis of discitis at L4-5. Unable to have MRI because of pacemaker. EXAM: CT LUMBAR SPINE WITH CONTRAST TECHNIQUE: Multidetector CT imaging of the lumbar spine was performed with intravenous contrast administration. Multiplanar CT image reconstructions were also generated. CONTRAST:  60 cc Isovue-300 COMPARISON:  10/20/2014.  10/07/2014.  08/10/2014.  FINDINGS: No changes significant finding at L3-4 or above. At L4-5, there is further worsening of erosive destruction of the vertebral bodies inferiorly at L4 and superiorly at L5 consistent with discitis osteomyelitis. There has been previous left hemilaminectomy. There is some residual bulging disc material. There is foraminal encroachment bilaterally by disc material, destroyed bone and inflammatory material, worse on the left than the right. No significant para spinous or psoas changes. L5-S1: Disc degeneration with endplate osteophytes and bulging of the disc. Facet degeneration. Mild narrowing of the lateral recesses and foramina without definite neural compression. IMPRESSION: Further worsening of erosive destructive changes at the L4-5 level consistent with persistent and worsened discitis osteomyelitis. Lateral recess and foraminal encroachment by disc, destroyed bone and inflammatory tissue. Electronically Signed   By: Nelson Chimes M.D.   On: 12/16/2014 12:32   Dg Chest Port 1 View  12/19/2014  CLINICAL DATA:  Short of breath, congestive heart failure EXAM: PORTABLE CHEST 1 VIEW COMPARISON:  Radiograph 12/07/2014 FINDINGS: Left-sided pacemaker with 6 continuous leads overlies normal cardiac silhouette. Low lung volumes. No effusion, infiltrate, pneumothorax. IMPRESSION: Low lung volumes.  No acute findings. Electronically Signed   By: Suzy Bouchard M.D.   On: 12/19/2014 14:28    Microbiology: Recent Results (from the past 240 hour(s))  Fungus Culture with Smear     Status: None (Preliminary result)   Collection Time: 12/17/14  3:52 PM  Result Value Ref Range Status   Specimen Description ABSCESS  Final   Special Requests L4 L5 DISC ASPIRATION  Final   Fungal Smear   Final    NO YEAST OR FUNGAL ELEMENTS SEEN Performed at [facility]    Culture   Final    YEAST ISOLATED;ID TO FOLLOW Performed at [facility]    Report Status PENDING  Incomplete  Culture,  routine-abscess     Status: None   Collection Time: 12/17/14  3:52 PM  Result Value Ref Range Status   Specimen Description ABSCESS  Final   Special Requests L4 L5 DISC ASPIRATION  Final   Gram Stain   Final    NO WBC SEEN NO SQUAMOUS EPITHELIAL CELLS SEEN NO ORGANISMS SEEN  Performed at [facility]    Culture   Final    NO GROWTH 3 DAYS Performed at [facility]    Report Status 12/21/2014 FINAL  Final  Urine culture     Status: None   Collection Time: 12/20/14  5:21 PM  Result Value Ref Range Status   Specimen Description URINE, RANDOM  Final   Special Requests NONE  Final   Culture >=100,000 COLONIES/mL YEAST  Final   Report Status 12/22/2014 FINAL  Final     Labs: Basic Metabolic Panel:  Recent Labs Lab 12/18/14 1347 12/19/14 0600 12/19/14 1250 12/20/14 0430 12/21/14 0725 12/23/14 1207 12/24/14 0452  NA  --  146*  --  144 145 142 142  K  --  2.9*  --  3.2* 3.7 3.8 3.5  CL  --  113*  --  112* 115* 112* 113*  CO2  --  25  --  26 22 23 24   GLUCOSE  --  96  --  110* 86 110* 116*  BUN  --  11  --  9 7 8 7   CREATININE  --  0.58  --  0.64 0.78 1.04* 0.93  CALCIUM  --  8.5*  --  8.4* 8.1* 8.7* 8.1*  MG 1.5*  --  1.7  --   --   --   --    Liver Function Tests:  Recent Labs Lab 12/19/14 2120  AST 20  ALT 12*  ALKPHOS 65  BILITOT 0.6  PROT 4.4*  ALBUMIN 1.9*   No results for input(s): LIPASE, AMYLASE in the last 168 hours.  Recent Labs Lab 12/19/14 2120  AMMONIA 34   CBC:  Recent Labs Lab 12/18/14 0315 12/19/14 0600 12/20/14 0430 12/21/14 0725 12/24/14 0452  WBC 6.6 6.3 6.4 5.7 4.3  HGB 8.3* 7.5* 7.9* 8.3* 7.4*  HCT 26.0* 23.5* 25.3* 27.2* 24.7*  MCV 82.8 82.2 83.8 84.7 87.0  PLT 106* 98* 102* 123* 180   Cardiac Enzymes: No results for input(s): CKTOTAL, CKMB, CKMBINDEX, TROPONINI in the last 168 hours. BNP: BNP (last 3 results) No results for input(s): BNP in the last 8760 hours.  ProBNP (last 3 results) No results for  input(s): PROBNP in the last 8760 hours.  CBG:  Recent Labs Lab 12/23/14 1133 12/23/14 1630 12/23/14 2020 12/24/14 0740 12/24/14 1134  GLUCAP 100* 109* 112* 109* 99       Signed:  Lynnix Schoneman MD [facility] 12/24/2014, 3:06 PM

## 2014-12-24 NOTE — Clinical Social Work Note (Signed)
Patient medically stable for [facility].  Discharge information transmitted to facility and patient will be transported by ambulance.  Patient's husband Harriett SineRick Jasek [phone]) contacted and informed of d/c back to facility.   Genelle BalVanessa Humza Tallerico, MSW, LCSW Licensed Clinical Social Worker Clinical Social Work Department [facility] [phone]

## 2014-12-24 NOTE — Progress Notes (Signed)
SLP Cancellation Note  Patient Details Name: [name] MRN: [id number] DOB: 1945/09/27   Cancelled treatment:  Attempted to provide skilled observation with solids and liquids for possible diet upgrade, however, patient confused and screaming and crying out in pain any time clinician attempted to reposition in bed greater than 20 degrees. Patient reported her "nerves are bad" and requested medication.  Clinician provided emotional support in order to calm patient. Patient eventually appeared calmer and was left supine in bed, resting quietly with her eyes closed. Due to patient's poor tolerance of an upright position, poor PO intake with refusal to consume trials and confusion, recommend patient continue current diet of Dys. 1 textures with thin liquids.     [name] 12/24/2014, 2:52 PM

## 2014-12-24 NOTE — Progress Notes (Signed)
Pt. Nurse tech notified nurse of foley leakage. Pulled back 10cc of fluid from foley, pushed back in. Flushed with 10cc of normal saline. Will continue to monitor.

## 2014-12-24 NOTE — Care Management Important Message (Signed)
Important Message  Patient Details  Name: Overton MamDoris Martis MRN: [id number] Date of Birth: Dec 29, 1945   Medicare Important Message Given:  Yes    Rayvon CharSTUTTS, Aariel Ems G 12/24/2014, 2:30 PMImportant Message  Patient Details  Name: Overton MamDoris Duddy MRN: [id number] Date of Birth: Dec 29, 1945   Medicare Important Message Given:  Yes    Waylan Busta G 12/24/2014, 2:30 PM

## 2015-01-12 LAB — FUNGUS CULTURE W SMEAR: FUNGAL SMEAR: NONE SEEN

## 2015-02-10 DEATH — deceased
